# Patient Record
Sex: Male | Born: 1945 | Race: Black or African American | Hispanic: No | Marital: Married | Smoking: Current every day smoker
Health system: Southern US, Community
[De-identification: ages and names within clinical notes are randomized; demographics above are authoritative.]

## PROBLEM LIST (undated history)

## (undated) DIAGNOSIS — I10 Essential (primary) hypertension: Secondary | ICD-10-CM

## (undated) DIAGNOSIS — H02409 Unspecified ptosis of unspecified eyelid: Secondary | ICD-10-CM

## (undated) DIAGNOSIS — H3320 Serous retinal detachment, unspecified eye: Secondary | ICD-10-CM

## (undated) DIAGNOSIS — H44111 Panuveitis, right eye: Secondary | ICD-10-CM

## (undated) DIAGNOSIS — K859 Acute pancreatitis without necrosis or infection, unspecified: Secondary | ICD-10-CM

## (undated) DIAGNOSIS — I82409 Acute embolism and thrombosis of unspecified deep veins of unspecified lower extremity: Secondary | ICD-10-CM

## (undated) DIAGNOSIS — D649 Anemia, unspecified: Secondary | ICD-10-CM

## (undated) DIAGNOSIS — M129 Arthropathy, unspecified: Secondary | ICD-10-CM

## (undated) DIAGNOSIS — H269 Unspecified cataract: Secondary | ICD-10-CM

## (undated) DIAGNOSIS — B5801 Toxoplasma chorioretinitis: Secondary | ICD-10-CM

## (undated) DIAGNOSIS — G629 Polyneuropathy, unspecified: Secondary | ICD-10-CM

## (undated) DIAGNOSIS — H5010 Unspecified exotropia: Secondary | ICD-10-CM

## (undated) DIAGNOSIS — C259 Malignant neoplasm of pancreas, unspecified: Principal | ICD-10-CM

## (undated) DIAGNOSIS — H0520 Unspecified exophthalmos: Secondary | ICD-10-CM

## (undated) HISTORY — DX: Malignant neoplasm of pancreas, unspecified: C25.9

---

## 1964-07-01 HISTORY — PX: TONSILLECTOMY: SUR1361

## 1997-10-20 ENCOUNTER — Ambulatory Visit (HOSPITAL_COMMUNITY): Admission: RE | Admit: 1997-10-20 | Discharge: 1997-10-20 | Payer: Self-pay | Admitting: Urology

## 2001-02-10 ENCOUNTER — Emergency Department (HOSPITAL_COMMUNITY): Admission: EM | Admit: 2001-02-10 | Discharge: 2001-02-10 | Payer: Self-pay

## 2003-09-19 ENCOUNTER — Emergency Department (HOSPITAL_COMMUNITY): Admission: EM | Admit: 2003-09-19 | Discharge: 2003-09-19 | Payer: Self-pay | Admitting: Emergency Medicine

## 2006-07-01 HISTORY — PX: NECK SURGERY: SHX720

## 2007-02-03 ENCOUNTER — Ambulatory Visit (HOSPITAL_COMMUNITY): Admission: RE | Admit: 2007-02-03 | Discharge: 2007-02-03 | Payer: Self-pay | Admitting: Urology

## 2007-03-24 ENCOUNTER — Encounter: Admission: RE | Admit: 2007-03-24 | Discharge: 2007-03-24 | Payer: Self-pay | Admitting: Neurology

## 2007-05-02 HISTORY — PX: SPINE SURGERY: SHX786

## 2007-05-12 ENCOUNTER — Inpatient Hospital Stay (HOSPITAL_COMMUNITY): Admission: RE | Admit: 2007-05-12 | Discharge: 2007-05-15 | Payer: Self-pay | Admitting: Neurosurgery

## 2007-07-02 HISTORY — PX: BACK SURGERY: SHX140

## 2007-07-17 ENCOUNTER — Ambulatory Visit (HOSPITAL_COMMUNITY): Admission: RE | Admit: 2007-07-17 | Discharge: 2007-07-17 | Payer: Self-pay | Admitting: Neurosurgery

## 2007-07-28 ENCOUNTER — Inpatient Hospital Stay (HOSPITAL_COMMUNITY): Admission: RE | Admit: 2007-07-28 | Discharge: 2007-07-29 | Payer: Self-pay | Admitting: Neurosurgery

## 2007-10-02 ENCOUNTER — Ambulatory Visit: Payer: Self-pay | Admitting: *Deleted

## 2009-03-31 ENCOUNTER — Ambulatory Visit: Payer: Self-pay | Admitting: Vascular Surgery

## 2010-11-13 NOTE — Discharge Summary (Signed)
NAMELAMARIUS, DIRR              ACCOUNT NO.:  [id number]   MEDICAL RECORD NO.:  [id number]          PATIENT TYPE:  INP   LOCATION:  3005                         FACILITY:  [facility]   PHYSICIAN:  Hilda Lias, M.D.   DATE OF BIRTH:  01-Mar-1946   DATE OF ADMISSION:  05/12/2007  DATE OF DISCHARGE:  05/15/2007                               DISCHARGE SUMMARY   ADMISSION DIAGNOSIS:  Cervical myelopathy with stenosis C3-4, 4-5, 5-6  and 6-7.   FINAL DIAGNOSIS:  Cervical myelopathy with stenosis C3-4, 4-5, 5-6 and 6-  7.   CLINICAL HISTORY:  The patient was admitted because of weakness and neck  pain.  X-rays showed severe cervical stenosis from C3 down to C6-7.  Surgery was advised.   LABORATORY DATA:  Normal.   HOSPITAL COURSE:  The patient was taken to surgery and four-level  anterior cervical diskectomy, decompression and fusion were done.  Today, he is doing much better.  The wounds all look normal.  He is  swallowing.  The weakness that he had is almost gone.  He is being  discharged today to be followed by me in my office in 3 weeks or before  if needed.   CONDITION ON DISCHARGE:  Improving.   DISCHARGE MEDICATIONS:  1. Percocet.  2. Diazepam.   DIET:  Regular.   ACTIVITY:  He is not to drive for at least a week.   FOLLOW UP:  He is to be seen by me in 3 weeks or before.           ______________________________  Hilda Lias, M.D.     EB/MEDQ  D:  05/15/2007  T:  05/16/2007  Job:  [id number]

## 2010-11-13 NOTE — H&P (Signed)
NAME:  Marcus Lang, SCHAMP NO.:  [id number]   MEDICAL RECORD NO.:  [id number]          PATIENT TYPE:  INP   LOCATION:  2899                         FACILITY:  [facility]   PHYSICIAN:  Hilda Lias, M.D.   DATE OF BIRTH:  [birth year]   DATE OF ADMISSION:  05/12/2007  DATE OF DISCHARGE:                              HISTORY & PHYSICAL   HISTORY OF PRESENT ILLNESS:  Marcus Lang is a gentleman who was seen by  me on multiple times because of back pain with radiation to his right  leg, pain to the knees, which gets worse when he sits for a long period  of time.  Also, he complains of numbness in both hands associated with a  grounding sound in his neck.  The patient was seen by a neurologist and  x-rays were done and because of the findings, he was sent to [place] for  evaluation.   PAST MEDICAL HISTORY:  Headache.   MEDICATIONS:  None.   SOCIAL HISTORY:  He drinks 2 ounces of alcohol a day and he smokes a  pack of cigarettes a day.   FAMILY HISTORY:  Mother died when she was 54 with Alzheimer's.  Father  died when he was 55 with cancer of the esophagus.   PHYSICAL EXAMINATION:  GENERAL:  The patient came to my office limping  on the right leg.  HEENT:  Head, nose and throat normal.  NECK:  He has had decrease of flexibility of the cervical spine.  LUNGS:  Clear.  HEART:  Sounds normal.  ABDOMEN:  Normal.  .  NEURO:  He has weakness of biceps and wrist extensor.  He has some  dorsiflexion weakness of his right foot.  Numbness in both hands.  Deep  tendon reflexes are 1+ with no Babinski.  He has Tinel signs negative  bilaterally.  Coordination, when he tried to walk in a straight line, he  has difficulty going sideways.   Cervical MRI and x-ray showed degenerative disc disease, C3 down to C6-  C7.   CLINICAL IMPRESSION:  Cervical myelopathy with cervical stenosis from C3  down to C7.  Right L5 myelopathy.   RECOMMENDATIONS:  The patient is being admitted for surgery.   He is  going to have a decompressive diskectomy from C3 down to C7.  He knows  about the risks of infection, CSF leak, postoperative pain, paralysis,  need for further surgery, stroke, damage to the vocal chord, failure of  hardware, need of surgery in future.  Also he is fully aware that this  surgery will not correct his lumbar area.          ______________________________  Hilda Lias, M.D.    EB/MEDQ  D:  05/12/2007  T:  05/12/2007  Job:  [id number]

## 2010-11-13 NOTE — Procedures (Signed)
DUPLEX ULTRASOUND OF ABDOMINAL AORTA   INDICATION:  Followup abdominal aortic dilatation or aneurysm seen on x-  ray.   HISTORY:  Diabetes:  No.  Cardiac:  No.  Hypertension:  Yes.  Smoking:  Quit about a year ago.  Connective Tissue Disorder:  No.  Family History:  No.  Previous Surgery:  No.   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   ___________________________________________  Janetta Hora. Fields, MD   MG/MEDQ  D:  03/31/2009  T:  03/31/2009  Job:  [id number]

## 2010-11-13 NOTE — Op Note (Signed)
NAMECUINN, WESTERHOLD NO.:  [id number]   MEDICAL RECORD NO.:  [id number]          PATIENT TYPE:  INP   LOCATION:  3005                         FACILITY:  [facility]   PHYSICIAN:  Hilda Lias, M.D.   DATE OF BIRTH:  10-31-1945   DATE OF PROCEDURE:  05/12/2007  DATE OF DISCHARGE:                               OPERATIVE REPORT   PREOPERATIVE DIAGNOSES:  1. Cervical stenosis, C3-4, C4-5, C5-6, C6-7.  2. Cervical myelopathy.  3. Chronic radiculopathy.   POSTOPERATIVE DIAGNOSES:  1. Cervical stenosis, C3-4, C4-5, C5-6, C6-7.  2. Cervical myelopathy.  3. Chronic radiculopathy.   PROCEDURE:  Anterior 3-4, 4-5, 5-6, 6-7 decompression of spinal cord,  foraminotomy, interbody fusion with allograft, __________  plate for  __________  C7, microscope.   SURGEON:  Hilda Lias, M.D.   ASSISTANT:  Hewitt Shorts, M.D.   <CLINICAL HISTORY/>  Mr. Lembke is a gentleman who was admitted because of neck pain  associated with weakness and difficulty with walking.  X-rays show  severe stenosis from C3 down to C6-7.  Surgery was advised, and the risk  were explained to him in the history and physical.   PROCEDURE:  The patient was taken to the OR and after intubation, the  neck was cleaned with DuraPrep.   A longitudinal incision was made through the skin and subcutaneous  tissue down to the cervical spine.  X-rays showed that indeed we were at  the level of 5-6.  From then on, we opened the anterior ligament which  was calcified.  With the drill, we had to remove the disk at the level  of 5-6.  The same procedure was done at the level of 4-5, 3-4, and 6-7.  Then we brought the microscope into the area.  We started at the level  of 5-6 which was the worst.  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  The posterior ligament was  opened, and using the 1 and 2-mm Kerrison punch, decompression of the  spinal cord as well as the foramen  was achieved.  The endplates were  drilled also.  Then from then on, the same procedure was done at the  level of C6-7, with quite a bit of the same finding of stenosis of the  canal and the foramen.  Decompression was also achieved.  At the level  of 4-5 and 5-6, the same procedure with removal of the endplate and  decompression of the spinal cord and the foramen was achieved.  At the  end, we had good decompression of the spinal cord at those 4 levels,  with good foraminotomy to decompress the C3, C4, C5 and C6, C7 nerve  roots.  Then having done this, we tailored 4 bone graft of 7 mm height  slightly lordotic, and they were inserted without any problem.  This was  followed by a plate using __________ plate  using a total of 10 screws.  X-rays showed good position of the plate as  well as the screws as well as the bone graft.  From then on,  the area  was irrigated.  A Hemovac was left in the precervical area, and the  wound was closed with Vicryl and Steri-Strips.           ______________________________  Hilda Lias, M.D.     EB/MEDQ  D:  05/12/2007  T:  05/12/2007  Job:  [id number]   cc:   Evie Lacks, MD  Lucrezia Starch. Earlene Plater, M.D.

## 2010-11-13 NOTE — Op Note (Signed)
NAMEANGELDEJESUS, CALLAHAM NO.:  [id number]   MEDICAL RECORD NO.:  [id number]          PATIENT TYPE:  OIB   LOCATION:  3535                         FACILITY:  [facility]   PHYSICIAN:  Hilda Lias, M.D.   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   PROCEDURE:  Right L4-5 laminotomy, decompression of the L4 and L5 nerve  roots.  Right L5-S1 diskectomy, removal of a large fragment adherent to  the L5 nerve root.  Microscope.   SURGEON:  Hilda Lias, M.D.   ASSISTANT:  Hewitt Shorts, M.D.   CLINICAL HISTORY:  Mr. Christoffel is a 65 year old gentleman who underwent a  cervical fusion because myelopathy several weeks ago.  He had been  complaining off and on right leg pain.  X-rays showed that he had  multiple level stenosis and spondylosis the worst at the level 4-5 and 5-  1 to the right side with some fragment of disk at the level of 5-1 to  the right.  The patient declined more conservative treatment.  The  surgery was explained to him and the risks were explained in the history  and physical.   PROCEDURE:  The patient was taken to the OR and he was positioned in  prone manner.  The back was cleaned with DuraPrep.  Midline incision was  made from L4 down to L5-S1.  X-rays showed that indeed the clamps were  at the level L4-5.  We went laterally and we dissected all the way down  at the level of 4-5, 5-1.  With the microscope we drilled the lower  lamina of L4 the upper of L5.  We went through a thick yellow ligament.  Indeed we found some narrowing, but there was no evidence of any  herniated disk.  The L4 nerve root was opened.  Then we went down one  level and we opened at the level of  L5-S1.  X-rays showed indeed, this  was the area of 5-1.  We found really thick calcified ligament and the  ligament was removed and we found that there was degenerative disk with  a piece of fragment adherent to the L5 nerve roots going to the foramen.  Lysis was accomplished and at the end we were able to decompress the L5  nerve root.  Then we went to the disk space.  We entered disk space and  we found mostly degenerative disk disease toward the midline but soft  tissue toward the lateral aspect.  At the end we had good decompression  for the S1, L5, L4 nerve root.  The area was irrigated.  Valsalva  maneuver was negative.  Then fentanyl and Depo-Medrol were left in the  epidural space and the wound was closed with Vicryl and Steri-Strips.           ______________________________  Lynne Logan  Jeral Fruit, M.D.     EB/MEDQ  D:  07/28/2007  T:  07/29/2007  Job:  [id number]

## 2010-11-13 NOTE — Procedures (Signed)
DUPLEX ULTRASOUND OF ABDOMINAL AORTA   INDICATION:  Recent lower abdominal x-ray suggestive of aortic ectasia.   HISTORY:  Diabetes:  No.  Cardiac:  No.  Hypertension:  Yes.  Smoking:  Pack per day.  Connective Tissue Disorder:  Family History:  No.  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   ___________________________________________  P. Liliane Bade, M.D.   MC/MEDQ  D:  10/02/2007  T:  10/02/2007  Job:  [id number]

## 2010-11-13 NOTE — H&P (Signed)
Marcus Lang, Marcus Lang NO.:  [id number]   MEDICAL RECORD NO.:  [id number]          PATIENT TYPE:  OIB   LOCATION:  3599                         FACILITY:  [facility]   PHYSICIAN:  Hilda Lias, M.D.   DATE OF BIRTH:  1946/04/03   DATE OF ADMISSION:  07/28/2007  DATE OF DISCHARGE:                              HISTORY & PHYSICAL   Marcus Lang is a gentleman who underwent anterior cervical fusion  __________  several months ago.  He did really well, but as a __________  of his problem that he had been having a lot of back pain but mostly  radiation to down the right leg all the way down to the right foot  associated with weakness and no pain whatsoever in the left leg.  Because of that, we went ahead and we did a lumbar myelogram, and  because if the finding, he was to be admitted for surgery.   PAST MEDICAL HISTORY:  Anterior cervical fusion, October  2008.   SOCIAL HISTORY:  He smokes a pack a day, and he drinks socially.   FAMILY HISTORY:  Mother died at the age of 58 __________ .  Father died  of cancer of the __________ ; he was 56.   PHYSICAL EXAMINATION:  GENERAL:  When the patient came to my office, he  was limping on the right leg.  HEENT:  Normal.  NECK:  He has an anterior scar on the neck.  He has a good flexibility.  LUNGS:  Clear.  HEART:  Heart sounds normal.  __________  normal.  EXTREMITIES:  Normal pulses.  NEUROLOGIC:  He has weakness on dorsiflexion of the right leg.  He has  decrease of sensation with the L5 dermatome.  The rest of the  neurological examination is normal.   Cervical spine x-ray after surgery showed good fusion from C3 to C7.  The myelogram showed that indeed he had some multiple level disk  disease, the worst at the level of 4-5 and 5-1 where he has a herniated  disk with __________ 4-5 with right L5-S1 foraminal stenosis.   IMPRESSION:  Right L4-5, L5-S1 herniated disk right side with  degenerative disk disease at multiple  levels of the lumbar spine.  Status post cervical fusion.   RECOMMENDATIONS:  The patient is being admitted for surgery.  The  patient is fully aware that his problem is mostly at the level of 4-5/5-  1.  He denies any pain whatsoever in the left leg.  He wants to proceed  with surgery, and  the procedure will be a right 4-5/5-1 diskectomy.  He is fully aware  that we are no longer interested in the left side because he has no  symptoms whatsoever, but eventually there is a possibility that he might  develop __________ effusion.  He was aware of the risks such as  infection, CSF leak, worsening pain, paralysis __________ .           ______________________________  Hilda Lias, M.D.     EB/MEDQ  D:  07/28/2007  T:  07/28/2007  Job:  [id number]

## 2011-01-30 ENCOUNTER — Other Ambulatory Visit: Payer: Self-pay | Admitting: Neurosurgery

## 2011-01-30 DIAGNOSIS — M5410 Radiculopathy, site unspecified: Secondary | ICD-10-CM

## 2011-01-30 DIAGNOSIS — M542 Cervicalgia: Secondary | ICD-10-CM

## 2011-02-04 ENCOUNTER — Ambulatory Visit
Admission: RE | Admit: 2011-02-04 | Discharge: 2011-02-04 | Disposition: A | Discharge status: Home / Self Care | Payer: Medicare Other | Source: Ambulatory Visit | Attending: Neurosurgery | Admitting: Neurosurgery

## 2011-02-04 DIAGNOSIS — M542 Cervicalgia: Secondary | ICD-10-CM

## 2011-02-04 DIAGNOSIS — M5410 Radiculopathy, site unspecified: Secondary | ICD-10-CM

## 2011-02-04 MED ORDER — DIAZEPAM 2 MG PO TABS
5.0000 mg | ORAL_TABLET | Freq: Once | ORAL | Status: AC
  Administered 2011-02-04: 5 mg via ORAL

## 2011-02-04 MED ORDER — IOHEXOL 300 MG/ML  SOLN
10.0000 mL | Freq: Once | INTRAMUSCULAR | Status: AC | PRN
  Administered 2011-02-04: 10 mL via INTRATHECAL

## 2011-03-21 LAB — BASIC METABOLIC PANEL
BUN: 6
Chloride: 104
Creatinine, Ser: 0.98
Glucose, Bld: 93
Potassium: 4.3

## 2011-03-21 LAB — CBC
HCT: 43.2
MCHC: 34.5
MCV: 101.9 — ABNORMAL HIGH
Platelets: 232
RDW: 14.1

## 2011-04-09 LAB — BASIC METABOLIC PANEL
BUN: 10
Calcium: 9.5
Creatinine, Ser: 1.04
GFR calc Af Amer: >60
GFR calc non Af Amer: >60

## 2011-04-09 LAB — CBC
HCT: 43
MCV: 100.9 — ABNORMAL HIGH
RBC: 4.26
WBC: 8.4

## 2011-10-01 ENCOUNTER — Encounter: Payer: Self-pay | Admitting: Emergency Medicine

## 2011-10-01 ENCOUNTER — Ambulatory Visit (INDEPENDENT_AMBULATORY_CARE_PROVIDER_SITE_OTHER): Payer: Medicare Other | Admitting: Emergency Medicine

## 2011-10-01 ENCOUNTER — Ambulatory Visit: Payer: Medicare Other

## 2011-10-01 VITALS — BP 129/82 | HR 71 | Temp 97.9°F | Resp 16 | Ht 66.0 in | Wt 147.0 lb

## 2011-10-01 DIAGNOSIS — R7989 Other specified abnormal findings of blood chemistry: Secondary | ICD-10-CM

## 2011-10-01 DIAGNOSIS — Z0000 Encounter for general adult medical examination without abnormal findings: Secondary | ICD-10-CM

## 2011-10-01 DIAGNOSIS — R05 Cough: Secondary | ICD-10-CM

## 2011-10-01 DIAGNOSIS — I10 Essential (primary) hypertension: Secondary | ICD-10-CM

## 2011-10-01 DIAGNOSIS — E785 Hyperlipidemia, unspecified: Secondary | ICD-10-CM

## 2011-10-01 LAB — CBC WITH DIFFERENTIAL/PLATELET
Basophils Absolute: 0 10*3/uL (ref 0.0–0.1)
Eosinophils Relative: 1 % (ref 0–5)
HCT: 40.9 % (ref 39.0–52.0)
Lymphocytes Relative: 36 % (ref 12–46)
MCV: 99 fL (ref 78.0–100.0)
Monocytes Absolute: 0.4 10*3/uL (ref 0.1–1.0)
Monocytes Relative: 7 % (ref 3–12)
RDW: 13.3 % (ref 11.5–15.5)
WBC: 5.1 10*3/uL (ref 4.0–10.5)

## 2011-10-01 LAB — POCT URINALYSIS DIPSTICK
Bilirubin, UA: NEGATIVE
Glucose, UA: NEGATIVE
Ketones, UA: NEGATIVE
Leukocytes, UA: NEGATIVE
Spec Grav, UA: 1.025

## 2011-10-01 LAB — LIPID PANEL
Cholesterol: 203 mg/dL — ABNORMAL HIGH (ref 0–200)
Total CHOL/HDL Ratio: 3.7 Ratio
Triglycerides: 112 mg/dL (ref <150)
VLDL: 22 mg/dL (ref 0–40)

## 2011-10-01 LAB — POCT UA - MICROSCOPIC ONLY: Bacteria, U Microscopic: NEGATIVE

## 2011-10-01 LAB — COMPREHENSIVE METABOLIC PANEL
AST: 22 U/L (ref 0–37)
BUN: 10 mg/dL (ref 6–23)
CO2: 27 mEq/L (ref 19–32)
Calcium: 9.7 mg/dL (ref 8.4–10.5)
Chloride: 103 mEq/L (ref 96–112)
Creat: 1.18 mg/dL (ref 0.50–1.35)
Glucose, Bld: 115 mg/dL — ABNORMAL HIGH (ref 70–99)

## 2011-10-01 LAB — PSA, MEDICARE: PSA: 0.68 ng/mL (ref <=4.00)

## 2011-10-01 MED ORDER — AMLODIPINE BESYLATE 5 MG PO TABS: 5.0000 mg | ORAL_TABLET | Freq: Every day | ORAL | Status: DC

## 2011-10-01 NOTE — Progress Notes (Signed)
  Subjective:    Patient ID: Marcus Lang, male    DOB: January 17, 1946, 66 y.o.   MRN: [id number]  HPI patient enters for his yearly physical exam. He has not resumed smoking. He has been under a great deal of stress related to trying to care for his brother who is an alcoholic. He also is the main person in his family to handle    Review of Systems  Constitutional: Positive for unexpected weight change.  HENT: Negative.   Eyes: Negative.   Respiratory: Negative.   Cardiovascular: Negative.   Gastrointestinal:       Had a decreased appetite due to all the stress he is going to  Genitourinary: Negative.   Musculoskeletal:       And he's had some back discomfort where he had surgery but overall is doing  Neurological: Negative.   Hematological: Negative.   Psychiatric/Behavioral: Negative.        Objective:   Physical Exam  Constitutional: He is oriented to person, place, and time. He appears well-developed and well-nourished.  HENT:  Head: Normocephalic and atraumatic.  Right Ear: External ear normal.  Left Ear: External ear normal.  Eyes: Pupils are equal, round, and reactive to light.  Neck: No JVD present. No tracheal deviation present. No thyromegaly present.  Cardiovascular: Normal rate, regular rhythm and normal heart sounds.  Exam reveals no gallop and no friction rub.   No murmur heard. Pulmonary/Chest: No stridor. No respiratory distress. He has no wheezes. He has no rales. He exhibits no tenderness.  Genitourinary: Prostate normal.  Musculoskeletal: He exhibits no edema.  Lymphadenopathy:    He has no cervical adenopathy.  Neurological: He is alert and oriented to person, place, and time. He has normal reflexes.  Skin:       There is a 1 x 1.5 cm rough excoriated area adjacent to the umbilicus    [facility] reading (PRIMARY) by  Dr.Quamaine Webb NAD.  Her EKG shows no acute changes normal sinus rhythm. T down V2     Assessment & Plan:   Overall Marcus Lang seems to be doing  pretty well. He is under a great deal of stress advised that he contact hospice to see if we can help with the situation with his brother.

## 2011-10-03 ENCOUNTER — Encounter: Payer: Self-pay | Admitting: Emergency Medicine

## 2011-11-26 ENCOUNTER — Encounter: Payer: Self-pay | Admitting: Emergency Medicine

## 2012-01-01 ENCOUNTER — Ambulatory Visit (INDEPENDENT_AMBULATORY_CARE_PROVIDER_SITE_OTHER): Payer: Medicare Other | Admitting: Emergency Medicine

## 2012-01-01 ENCOUNTER — Ambulatory Visit: Payer: Medicare Other

## 2012-01-01 ENCOUNTER — Ambulatory Visit
Admission: RE | Admit: 2012-01-01 | Discharge: 2012-01-01 | Disposition: A | Discharge status: Home / Self Care | Payer: Medicare Other | Source: Ambulatory Visit | Attending: Emergency Medicine | Admitting: Emergency Medicine

## 2012-01-01 VITALS — BP 122/74 | HR 71 | Temp 98.6°F | Resp 14 | Ht 66.0 in | Wt 142.0 lb

## 2012-01-01 DIAGNOSIS — R109 Unspecified abdominal pain: Secondary | ICD-10-CM

## 2012-01-01 DIAGNOSIS — N12 Tubulo-interstitial nephritis, not specified as acute or chronic: Secondary | ICD-10-CM

## 2012-01-01 DIAGNOSIS — R3129 Other microscopic hematuria: Secondary | ICD-10-CM

## 2012-01-01 LAB — POCT URINALYSIS DIPSTICK
Glucose, UA: NEGATIVE
Nitrite, UA: NEGATIVE
Protein, UA: 30
Spec Grav, UA: 1.02
Urobilinogen, UA: 1

## 2012-01-01 LAB — COMPREHENSIVE METABOLIC PANEL
ALT: 23 U/L (ref 0–53)
AST: 35 U/L (ref 0–37)
Albumin: 4.4 g/dL (ref 3.5–5.2)
Alkaline Phosphatase: 129 U/L — ABNORMAL HIGH (ref 39–117)
Glucose, Bld: 120 mg/dL — ABNORMAL HIGH (ref 70–99)
Potassium: 3.9 mEq/L (ref 3.5–5.3)
Sodium: 140 mEq/L (ref 135–145)
Total Bilirubin: 1.2 mg/dL (ref 0.3–1.2)
Total Protein: 7 g/dL (ref 6.0–8.3)

## 2012-01-01 LAB — POCT UA - MICROSCOPIC ONLY
Casts, Ur, LPF, POC: NEGATIVE
Crystals, Ur, HPF, POC: NEGATIVE
Yeast, UA: NEGATIVE

## 2012-01-01 LAB — POCT CBC
Hemoglobin: 13.4 g/dL — AB (ref 14.1–18.1)
MCH, POC: 32.4 pg — AB (ref 27–31.2)
MCHC: 31.7 g/dL — AB (ref 31.8–35.4)
MID (cbc): 0.4 (ref 0–0.9)
MPV: 9 fL (ref 0–99.8)
POC Granulocyte: 2.5 (ref 2–6.9)
POC MID %: 9 %M (ref 0–12)
Platelet Count, POC: 219 10*3/uL (ref 142–424)
RBC: 4.14 M/uL — AB (ref 4.69–6.13)

## 2012-01-01 LAB — POCT SEDIMENTATION RATE: POCT SED RATE: 33 mm/hr — AB (ref 0–22)

## 2012-01-01 MED ORDER — IOHEXOL 300 MG/ML  SOLN
100.0000 mL | Freq: Once | INTRAMUSCULAR | Status: AC | PRN
  Administered 2012-01-01: 100 mL via INTRAVENOUS

## 2012-01-01 MED ORDER — CIPROFLOXACIN HCL 500 MG PO TABS: 500.0000 mg | ORAL_TABLET | Freq: Two times a day (BID) | ORAL | Status: AC

## 2012-01-01 NOTE — Progress Notes (Signed)
Subjective:    Patient ID: Marcus Lang, male    DOB: 05/22/1946, 66 y.o.   MRN: [id number]  HPI finish because the last 2 weeks she has had pain in his left upper abdomen. This has been associated with a 7 pound weight loss over the last couple of months. He has had a significant decrease in his appetite. He has not had any vomiting but does have an early satiety bowel movements have been normal. He has not noticed any urinary difficulty. He has not had any trauma to his abdomen to    Review of Systems     Objective:   Physical Exam  Constitutional: He appears well-developed and well-nourished.  HENT:  Head: Normocephalic.  Eyes: Pupils are equal, round, and reactive to light.  Neck: No thyromegaly present.  Cardiovascular: Normal rate and regular rhythm.   Pulmonary/Chest: No respiratory distress. He has no wheezes. He has no rales.  Abdominal:       The abdomen is flat. Bowel sounds are normal. With deep inspiration there is tenderness with a questionable spleen tip in the left upper abdomen. There is no lower abdominal discomfort     Results for orders placed in visit on 01/01/12  POCT CBC      Component Value Range   WBC 4.4 (*) 4.6 - 10.2 K/uL   Lymph, poc 1.5  0.6 - 3.4   POC LYMPH PERCENT 33.6  10 - 50 %L   MID (cbc) 0.4  0 - 0.9   POC MID % 9.0  0 - 12 %M   POC Granulocyte 2.5  2 - 6.9   Granulocyte percent 57.4  37 - 80 %G   RBC 4.14 (*) 4.69 - 6.13 M/uL   Hemoglobin 13.4 (*) 14.1 - 18.1 g/dL   HCT, POC 30.8 (*) 65.7 - 53.7 %   MCV 102.1 (*) 80 - 97 fL   MCH, POC 32.4 (*) 27 - 31.2 pg   MCHC 31.7 (*) 31.8 - 35.4 g/dL   RDW, POC 84.6     Platelet Count, POC 219  142 - 424 K/uL   MPV 9.0  0 - 99.8 fL  POCT URINALYSIS DIPSTICK      Component Value Range   Color, UA yellow     Clarity, UA clear     Glucose, UA neg     Bilirubin, UA neg     Ketones, UA neg     Spec Grav, UA 1.020     Blood, UA mod     pH, UA 6.0     Protein, UA 30     Urobilinogen, UA  1.0     Nitrite, UA neg     Leukocytes, UA Trace    POCT UA - MICROSCOPIC ONLY      Component Value Range   WBC, Ur, HPF, POC 2-6     RBC, urine, microscopic 6-11     Bacteria, U Microscopic 2+     Mucus, UA pos     Epithelial cells, urine per micros 2-3     Crystals, Ur, HPF, POC neg     Casts, Ur, LPF, POC neg     Yeast, UA neg     [facility] reading (PRIMARY) by  Dr. Cleta Alberts 2 pea-sized areas of calcification in the left upper abdomen there are 2 shadows in the left upper abdomen raising the question of an accessory spleen there are no acute findings seen on his acute abdominal series chest x-ray appears  normal       Assessment & Plan:  His urine is abnormal we'll go ahead and check a urine culture. He is scheduled for CT abdomen and pelvis with attention to the left upper abdomen. He was given A strainer. CT scan showed questionable inflammation around the pancreas versus possible inflammation around the left kidney. There were calcified granulomas in the liver. We'll go ahead and treat with Cipro 500 twice a day x1 week recheck one week if not better needs a cemented amylase lipase. We'll make sure he is not drinking any alcoholic beverages.

## 2012-01-03 LAB — URINE CULTURE: Organism ID, Bacteria: NO GROWTH

## 2012-01-09 ENCOUNTER — Ambulatory Visit (INDEPENDENT_AMBULATORY_CARE_PROVIDER_SITE_OTHER): Payer: Medicare Other | Admitting: Emergency Medicine

## 2012-01-09 VITALS — BP 122/76 | HR 70 | Temp 99.0°F | Resp 18 | Ht 67.0 in | Wt 140.0 lb

## 2012-01-09 DIAGNOSIS — I10 Essential (primary) hypertension: Secondary | ICD-10-CM

## 2012-01-09 DIAGNOSIS — M3390 Dermatopolymyositis, unspecified, organ involvement unspecified: Secondary | ICD-10-CM

## 2012-01-09 DIAGNOSIS — M3313 Other dermatomyositis without myopathy: Secondary | ICD-10-CM

## 2012-01-09 DIAGNOSIS — R7309 Other abnormal glucose: Secondary | ICD-10-CM

## 2012-01-09 DIAGNOSIS — R739 Hyperglycemia, unspecified: Secondary | ICD-10-CM

## 2012-01-09 LAB — POCT CBC
Granulocyte percent: 60.2 %G (ref 37–80)
MCV: 102.2 fL — AB (ref 80–97)
MID (cbc): 0.6 (ref 0–0.9)
MPV: 8 fL (ref 0–99.8)
POC Granulocyte: 4.6 (ref 2–6.9)
POC LYMPH PERCENT: 32.1 %L (ref 10–50)
POC MID %: 7.7 %M (ref 0–12)
Platelet Count, POC: 371 10*3/uL (ref 142–424)
RBC: 4.04 M/uL — AB (ref 4.69–6.13)
RDW, POC: 12.5 %

## 2012-01-09 LAB — POCT UA - MICROSCOPIC ONLY
Epithelial cells, urine per micros: NEGATIVE
WBC, Ur, HPF, POC: NEGATIVE
Yeast, UA: NEGATIVE

## 2012-01-09 LAB — POCT URINALYSIS DIPSTICK
Bilirubin, UA: NEGATIVE
Leukocytes, UA: NEGATIVE
Nitrite, UA: NEGATIVE
Protein, UA: NEGATIVE
pH, UA: 5.5

## 2012-01-09 NOTE — Progress Notes (Signed)
  Subjective:    Patient ID: Marcus Lang, male    DOB: 03-Dec-1945, 66 y.o.   MRN: [id number]  HPI patient in followup recent episode of abdominal pain. His CT showed some question of pancreatic inflammation and questionable inflammation around the left kidney. He has been treated with antibiotics and feels significantly better except for lack of return of his appetite. He check with Dr. Kinnie Scales and is scheduled for a colonoscopy in 2015.    Review of Systems     Objective:   Physical Exam HEENT exam is unremarkable his chest is clear his cardiac exam has regular rate no murmur the abdomen is soft and nontender there is no CVA tenderness.   Results for orders placed in visit on 01/09/12  POCT GLYCOSYLATED HEMOGLOBIN (HGB A1C)      Component Value Range   Hemoglobin A1C 5.1    POCT UA - MICROSCOPIC ONLY      Component Value Range   WBC, Ur, HPF, POC neg     RBC, urine, microscopic 1-3     Bacteria, U Microscopic neg     Mucus, UA neg     Epithelial cells, urine per micros neg     Crystals, Ur, HPF, POC neg     Casts, Ur, LPF, POC neg     Yeast, UA neg    POCT URINALYSIS DIPSTICK      Component Value Range   Color, UA yellow     Clarity, UA clear     Glucose, UA neg     Bilirubin, UA neg     Ketones, UA neg     Spec Grav, UA 1.010     Blood, UA small     pH, UA 5.5     Protein, UA neg     Urobilinogen, UA 0.2     Nitrite, UA neg     Leukocytes, UA Negative         Assessment & Plan:  Patient's sugar was up on last check we'll go ahead and repeat this today along with an A1c and check amylase lipase because his pancreas did appear to show some edema on his CT scan. Go ahead and do an amylase lipase today the he has lost a total of 7 pounds since he was here in April. Since his diet significantly to his weight recheck here in 2 weeks with attention to his weight if he continues to be dropping we'll need to do further acute evaluation of weight loss. Of note he is a smoker  but had chest x-ray in April that was read as no acute disease.

## 2012-01-10 ENCOUNTER — Other Ambulatory Visit: Payer: Self-pay | Admitting: Emergency Medicine

## 2012-01-10 DIAGNOSIS — K859 Acute pancreatitis without necrosis or infection, unspecified: Secondary | ICD-10-CM

## 2012-01-10 LAB — LIPASE: Lipase: 1180 U/L — ABNORMAL HIGH (ref 0–75)

## 2012-01-14 ENCOUNTER — Ambulatory Visit
Admission: RE | Admit: 2012-01-14 | Discharge: 2012-01-14 | Disposition: A | Discharge status: Home / Self Care | Payer: Medicare Other | Source: Ambulatory Visit | Attending: Emergency Medicine | Admitting: Emergency Medicine

## 2012-01-14 DIAGNOSIS — K859 Acute pancreatitis without necrosis or infection, unspecified: Secondary | ICD-10-CM

## 2012-01-21 ENCOUNTER — Other Ambulatory Visit: Payer: Self-pay | Admitting: Emergency Medicine

## 2012-04-07 ENCOUNTER — Ambulatory Visit (INDEPENDENT_AMBULATORY_CARE_PROVIDER_SITE_OTHER): Payer: Medicare Other | Admitting: Emergency Medicine

## 2012-04-07 ENCOUNTER — Ambulatory Visit: Payer: Medicare Other | Admitting: Emergency Medicine

## 2012-04-07 VITALS — BP 140/80 | HR 69 | Temp 98.0°F | Resp 16 | Ht 68.0 in | Wt 140.6 lb

## 2012-04-07 DIAGNOSIS — D649 Anemia, unspecified: Secondary | ICD-10-CM

## 2012-04-07 DIAGNOSIS — M129 Arthropathy, unspecified: Secondary | ICD-10-CM

## 2012-04-07 DIAGNOSIS — D531 Other megaloblastic anemias, not elsewhere classified: Secondary | ICD-10-CM

## 2012-04-07 DIAGNOSIS — M1990 Unspecified osteoarthritis, unspecified site: Secondary | ICD-10-CM | POA: Insufficient documentation

## 2012-04-07 DIAGNOSIS — D518 Other vitamin B12 deficiency anemias: Secondary | ICD-10-CM

## 2012-04-07 DIAGNOSIS — K859 Acute pancreatitis without necrosis or infection, unspecified: Secondary | ICD-10-CM | POA: Insufficient documentation

## 2012-04-07 DIAGNOSIS — D539 Nutritional anemia, unspecified: Secondary | ICD-10-CM

## 2012-04-07 HISTORY — DX: Arthropathy, unspecified: M12.9

## 2012-04-07 HISTORY — DX: Acute pancreatitis without necrosis or infection, unspecified: K85.90

## 2012-04-07 LAB — CBC WITH DIFFERENTIAL/PLATELET
Basophils Absolute: 0 10*3/uL (ref 0.0–0.1)
Basophils Relative: 0 % (ref 0–1)
Eosinophils Absolute: 0.1 10*3/uL (ref 0.0–0.7)
Eosinophils Relative: 1 % (ref 0–5)
HCT: 39.3 % (ref 39.0–52.0)
MCHC: 35.6 g/dL (ref 30.0–36.0)
MCV: 93.3 fL (ref 78.0–100.0)
Monocytes Absolute: 0.3 10*3/uL (ref 0.1–1.0)
Platelets: 224 10*3/uL (ref 150–400)
RDW: 13.5 % (ref 11.5–15.5)

## 2012-04-07 NOTE — Progress Notes (Signed)
  Subjective:    Patient ID: Marcus Lang, male    DOB: [birth year], 66 y.o.   MRN: [id number]  HPI patient enters for followup of pancreatitis and anemia. He did undergo a CT of the abdomen and pelvis which revealed some inflammation around the pancreas and kidney. Ultrasound did not show any signs of gallstones. Since his last visit here he has had no episodes of nausea vomiting or abdominal pain. He states he was unable to eat without difficulty he eats only about one meal a day but this is no different than previous.    Review of Systems     Objective:   Physical Exam  Constitutional: He appears well-developed and well-nourished.  Neck: No thyromegaly present.  Cardiovascular: Normal rate, regular rhythm and normal heart sounds.  Exam reveals no gallop and no friction rub.   No murmur heard. Pulmonary/Chest: Effort normal and breath sounds normal. No respiratory distress. He has no wheezes. He has no rales. He exhibits no tenderness.  Abdominal: Soft. He exhibits no distension and no mass. There is no tenderness. There is no rebound and no guarding.          Assessment & Plan:  Patient enters followup episode of pancreatitis. He also was found to be anemic. He states he feels fine. We'll recheck labs today. He declines a flu shot today. No change in treatment program. His blood pressure is good on amlodipine.Marland Kitchen

## 2012-04-07 NOTE — Patient Instructions (Addendum)
Acute Pancreatitis  Acute pancreatitis is a disease in which the pancreas becomes suddenly inflamed. The pancreas is a large gland located behind your stomach. The pancreas produces enzymes that help digest food. The pancreas also releases the hormones glucagon and insulin that help regulate blood sugar. Damage to the pancreas occurs when the digestive enzymes from the pancreas are activated and begin attacking the pancreas before being released into the intestine. Most acute attacks last a couple of days and can cause serious complications. Some people become dehydrated and develop low blood pressure. In severe cases, bleeding into the pancreas can lead to shock and can be life-threatening. The lungs, heart, and kidneys may fail.  CAUSES   Pancreatitis can happen to anyone. In some cases, the cause is unknown. Most cases are caused by:   Alcohol abuse.   Gallstones.  Other less common causes are:   Certain medicines.   Exposure to certain chemicals.   Infection.   Damage caused by an accident (trauma).   Abdominal surgery.  SYMPTOMS    Pain in the upper abdomen that may radiate to the back.   Tenderness and swelling of the abdomen.   Nausea and vomiting.  DIAGNOSIS   Your caregiver will perform a physical exam. Blood and stool tests may be done to confirm the diagnosis. Imaging tests may also be done, such as X-rays, CT scans, or an ultrasound of the abdomen.  TREATMENT   Treatment usually requires a stay in the hospital. Treatment may include:   Pain medicine.   Fluid replacement through an intravenous line (IV).   Placing a tube in the stomach to remove stomach contents and control vomiting.   Not eating for 3 or 4 days. This gives your pancreas a rest, because enzymes are not being produced that can cause further damage.   Antibiotic medicines if your condition is caused by an infection.   Surgery of the pancreas or gallbladder.  HOME CARE INSTRUCTIONS    Follow the diet advised by your  caregiver. This may involve avoiding alcohol and decreasing the amount of fat in your diet.   Eat smaller, more frequent meals. This reduces the amount of digestive juices the pancreas produces.   Drink enough fluids to keep your urine clear or pale yellow.   Only take over-the-counter or prescription medicines as directed by your caregiver.   Avoid drinking alcohol if it caused your condition.   Do not smoke.   Get plenty of rest.   Check your blood sugar at home as directed by your caregiver.   Keep all follow-up appointments as directed by your caregiver.  SEEK MEDICAL CARE IF:    You do not recover as quickly as expected.   You develop new or worsening symptoms.   You have persistent pain, weakness, or nausea.   You recover and then have another episode of pain.  SEEK IMMEDIATE MEDICAL CARE IF:    You are unable to eat or keep fluids down.   Your pain becomes severe.   You have a fever or persistent symptoms for more than 2 to 3 days.   You have a fever and your symptoms suddenly get worse.   Your skin or the white part of your eyes turn yellow (jaundice).   You develop vomiting.   You feel dizzy, or you faint.   Your blood sugar is high (over 300 mg/dL).  MAKE SURE YOU:    Understand these instructions.   Will watch your condition.   

## 2012-04-21 ENCOUNTER — Ambulatory Visit: Payer: Medicare Other | Admitting: Emergency Medicine

## 2012-08-11 ENCOUNTER — Ambulatory Visit (INDEPENDENT_AMBULATORY_CARE_PROVIDER_SITE_OTHER): Payer: Medicare Other | Admitting: Emergency Medicine

## 2012-08-11 VITALS — BP 144/84 | HR 70 | Resp 16 | Ht 68.0 in | Wt 144.0 lb

## 2012-08-11 DIAGNOSIS — K859 Acute pancreatitis without necrosis or infection, unspecified: Secondary | ICD-10-CM

## 2012-08-11 DIAGNOSIS — I10 Essential (primary) hypertension: Secondary | ICD-10-CM

## 2012-08-11 LAB — CBC WITH DIFFERENTIAL/PLATELET
Eosinophils Absolute: 0.1 10*3/uL (ref 0.0–0.7)
Eosinophils Relative: 1 % (ref 0–5)
HCT: 38.3 % — ABNORMAL LOW (ref 39.0–52.0)
Hemoglobin: 13.6 g/dL (ref 13.0–17.0)
Lymphs Abs: 1.6 10*3/uL (ref 0.7–4.0)
MCH: 33.7 pg (ref 26.0–34.0)
MCV: 94.8 fL (ref 78.0–100.0)
Monocytes Absolute: 0.3 10*3/uL (ref 0.1–1.0)
Monocytes Relative: 6 % (ref 3–12)
Neutrophils Relative %: 59 % (ref 43–77)
RBC: 4.04 MIL/uL — ABNORMAL LOW (ref 4.22–5.81)

## 2012-08-11 LAB — LIPID PANEL
Cholesterol: 207 mg/dL — ABNORMAL HIGH (ref 0–200)
Triglycerides: 89 mg/dL (ref <150)
VLDL: 18 mg/dL (ref 0–40)

## 2012-08-11 LAB — COMPREHENSIVE METABOLIC PANEL
CO2: 27 mEq/L (ref 19–32)
Glucose, Bld: 157 mg/dL — ABNORMAL HIGH (ref 70–99)
Sodium: 139 mEq/L (ref 135–145)
Total Bilirubin: 0.9 mg/dL (ref 0.3–1.2)
Total Protein: 7.1 g/dL (ref 6.0–8.3)

## 2012-08-11 NOTE — Progress Notes (Signed)
  Subjective:    Patient ID: Marcus Lang, male    DOB: 08-Aug-1945, 67 y.o.   MRN: [id number]  HPI patient enters for followup of hypertension and anemia and pancreatitis. He feels great. He has been taking all his medication. He does have significant stress at home mainly related to taking care of multiple family members. He physically and financially assist in the care of almost all his grandchildren. He has not had any chest pain abdominal pain or any other symptoms    Review of Systems     Objective:   Physical Exam patient is alert and cooperative not in distress. His neck is supple chest clear heart regular rate no murmurs abdomen soft nontender . Extremities have no edema        Assessment & Plan:  Patient stable at present no changes in medications. Routine labs were done today.

## 2012-11-02 ENCOUNTER — Other Ambulatory Visit: Payer: Self-pay | Admitting: Emergency Medicine

## 2012-12-22 ENCOUNTER — Encounter: Payer: Self-pay | Admitting: Emergency Medicine

## 2012-12-22 ENCOUNTER — Ambulatory Visit: Payer: Medicare Other

## 2012-12-22 ENCOUNTER — Ambulatory Visit (INDEPENDENT_AMBULATORY_CARE_PROVIDER_SITE_OTHER): Payer: Medicare Other | Admitting: Emergency Medicine

## 2012-12-22 ENCOUNTER — Other Ambulatory Visit: Payer: Self-pay | Admitting: Emergency Medicine

## 2012-12-22 VITALS — BP 120/86 | HR 61 | Temp 98.0°F | Resp 16 | Ht 67.0 in | Wt 136.6 lb

## 2012-12-22 DIAGNOSIS — Z0000 Encounter for general adult medical examination without abnormal findings: Secondary | ICD-10-CM

## 2012-12-22 DIAGNOSIS — R634 Abnormal weight loss: Secondary | ICD-10-CM

## 2012-12-22 DIAGNOSIS — Z139 Encounter for screening, unspecified: Secondary | ICD-10-CM

## 2012-12-22 DIAGNOSIS — M549 Dorsalgia, unspecified: Secondary | ICD-10-CM

## 2012-12-22 LAB — CBC WITH DIFFERENTIAL/PLATELET
Eosinophils Relative: 3 % (ref 0–5)
HCT: 43.9 % (ref 39.0–52.0)
Hemoglobin: 15 g/dL (ref 13.0–17.0)
Lymphocytes Relative: 40 % (ref 12–46)
Lymphs Abs: 2.3 10*3/uL (ref 0.7–4.0)
MCV: 85.1 fL (ref 78.0–100.0)
Monocytes Absolute: 0.5 10*3/uL (ref 0.1–1.0)
RBC: 5.16 MIL/uL (ref 4.22–5.81)
WBC: 5.8 10*3/uL (ref 4.0–10.5)

## 2012-12-22 LAB — COMPREHENSIVE METABOLIC PANEL
ALT: 13 U/L (ref 0–53)
BUN: 9 mg/dL (ref 6–23)
CO2: 26 mEq/L (ref 19–32)
Calcium: 9.5 mg/dL (ref 8.4–10.5)
Chloride: 102 mEq/L (ref 96–112)
Creat: 0.9 mg/dL (ref 0.50–1.35)

## 2012-12-22 LAB — POCT URINALYSIS DIPSTICK
Bilirubin, UA: NEGATIVE
Glucose, UA: NEGATIVE
Spec Grav, UA: 1.01
Urobilinogen, UA: 0.2

## 2012-12-22 LAB — LIPID PANEL
Cholesterol: 153 mg/dL (ref 0–200)
HDL: 51 mg/dL (ref >39)
LDL Cholesterol: 82 mg/dL (ref 0–99)
Triglycerides: 99 mg/dL (ref <150)

## 2012-12-22 NOTE — Progress Notes (Signed)
  Subjective:    Patient ID: Marcus Lang, male    DOB: 17-May-1946, 67 y.o.   MRN: [id number]  HPI    Review of Systems  Constitutional: Positive for appetite change.  HENT: Negative.   Eyes: Negative.   Respiratory: Negative.   Cardiovascular: Negative.   Gastrointestinal: Negative.   Endocrine: Negative.   Genitourinary: Positive for testicular pain.  Musculoskeletal: Negative.   Skin: Negative.   Allergic/Immunologic: Negative.   Neurological: Negative.   Hematological: Negative.   Psychiatric/Behavioral: Negative.        Objective:   Physical Exam HEENT exam is unremarkable. Neck is supple chest is clear abdomen is soft without tenderness or masses. Cardiac is regular rate without murmurs. Rectal exam shows a normal prostate stool was sent for hematuric  Extremities are without cyanosis clubbing or edema. There are scars present over the anterior C-spine and lower lumbar spine [facility] reading (PRIMARY) by  Dr. Cleta Alberts no acute disease       Assessment & Plan:  It is unclear whether his weight loss is due to all the stress he is under a whether he could have something going on occultly. Routine labs were done we'll repeat a CT abdomen and pelvis to be sure the pancreas is okay routine chest x-ray will also be done because he does have a smoking history. He will b no if he improves with his weight after he tries the protein supplements. If he fails we'll try supplements for pancreatic insufficiency .

## 2012-12-24 ENCOUNTER — Telehealth: Payer: Self-pay | Admitting: Radiology

## 2012-12-24 ENCOUNTER — Ambulatory Visit
Admission: RE | Admit: 2012-12-24 | Discharge: 2012-12-24 | Disposition: A | Discharge status: Home / Self Care | Payer: Medicare Other | Source: Ambulatory Visit | Attending: Emergency Medicine | Admitting: Emergency Medicine

## 2012-12-24 ENCOUNTER — Other Ambulatory Visit: Payer: Self-pay | Admitting: Emergency Medicine

## 2012-12-24 DIAGNOSIS — R634 Abnormal weight loss: Secondary | ICD-10-CM

## 2012-12-24 DIAGNOSIS — K869 Disease of pancreas, unspecified: Secondary | ICD-10-CM

## 2012-12-24 DIAGNOSIS — R932 Abnormal findings on diagnostic imaging of liver and biliary tract: Secondary | ICD-10-CM

## 2012-12-24 LAB — HEMOGLOBIN A1C
Hgb A1c MFr Bld: 5.4 % (ref <5.7)
Mean Plasma Glucose: 108 mg/dL (ref <117)

## 2012-12-24 MED ORDER — IOHEXOL 300 MG/ML  SOLN
100.0000 mL | Freq: Once | INTRAMUSCULAR | Status: AC | PRN
  Administered 2012-12-24: 100 mL via INTRAVENOUS

## 2012-12-24 NOTE — Telephone Encounter (Signed)
Called patient to see if he and his wife can come in for CT review today. He is coming today. Have called Dr Myna Hidalgo to call Dr Cleta Alberts about this patient.

## 2012-12-24 NOTE — Telephone Encounter (Signed)
Call report from radiology new lesions pancreas and liver / worrisome for pancreatic CA with metastasis to liver, report being faxed, not able to be viewed in epic yet.

## 2012-12-24 NOTE — Telephone Encounter (Signed)
Put in referral to Dr Myna Hidalgo. We have not heard from him yet.

## 2012-12-25 ENCOUNTER — Other Ambulatory Visit: Payer: Self-pay | Admitting: Radiology

## 2012-12-25 DIAGNOSIS — K869 Disease of pancreas, unspecified: Secondary | ICD-10-CM

## 2012-12-28 ENCOUNTER — Encounter: Payer: Self-pay | Admitting: Emergency Medicine

## 2012-12-29 ENCOUNTER — Encounter (HOSPITAL_COMMUNITY): Payer: Self-pay | Admitting: *Deleted

## 2012-12-29 ENCOUNTER — Encounter (HOSPITAL_COMMUNITY): Payer: Self-pay | Admitting: Pharmacy Technician

## 2012-12-29 ENCOUNTER — Other Ambulatory Visit: Payer: Self-pay | Admitting: Gastroenterology

## 2012-12-29 NOTE — Addendum Note (Signed)
Addended by: Willis Modena on: 12/29/2012 08:43 PM   Modules accepted: Orders

## 2012-12-29 NOTE — Progress Notes (Signed)
Thank you so much for your help with Marcus Lang. He is a very nice man 

## 2012-12-30 ENCOUNTER — Encounter: Payer: Self-pay | Admitting: Hematology & Oncology

## 2012-12-30 ENCOUNTER — Other Ambulatory Visit: Payer: Self-pay | Admitting: Hematology & Oncology

## 2012-12-30 ENCOUNTER — Ambulatory Visit (HOSPITAL_COMMUNITY): Payer: Medicare Other | Admitting: Anesthesiology

## 2012-12-30 ENCOUNTER — Encounter (HOSPITAL_COMMUNITY): Payer: Self-pay | Admitting: Anesthesiology

## 2012-12-30 ENCOUNTER — Ambulatory Visit (HOSPITAL_COMMUNITY)
Admission: RE | Admit: 2012-12-30 | Discharge: 2012-12-30 | Disposition: A | Discharge status: Home / Self Care | Payer: Medicare Other | Source: Ambulatory Visit | Attending: Gastroenterology | Admitting: Gastroenterology

## 2012-12-30 ENCOUNTER — Encounter (HOSPITAL_COMMUNITY)
Admission: RE | Disposition: A | Discharge status: Home / Self Care | Payer: Self-pay | Source: Ambulatory Visit | Attending: Gastroenterology

## 2012-12-30 ENCOUNTER — Encounter (HOSPITAL_COMMUNITY): Payer: Self-pay

## 2012-12-30 DIAGNOSIS — K7689 Other specified diseases of liver: Secondary | ICD-10-CM | POA: Insufficient documentation

## 2012-12-30 DIAGNOSIS — C259 Malignant neoplasm of pancreas, unspecified: Secondary | ICD-10-CM

## 2012-12-30 DIAGNOSIS — C251 Malignant neoplasm of body of pancreas: Secondary | ICD-10-CM | POA: Insufficient documentation

## 2012-12-30 HISTORY — PX: EUS: SHX5427

## 2012-12-30 HISTORY — DX: Essential (primary) hypertension: I10

## 2012-12-30 HISTORY — DX: Malignant neoplasm of pancreas, unspecified: C25.9

## 2012-12-30 SURGERY — ESOPHAGEAL ENDOSCOPIC ULTRASOUND (EUS) RADIAL
Anesthesia: Monitor Anesthesia Care

## 2012-12-30 MED ORDER — ONDANSETRON HCL 4 MG/2ML IJ SOLN
INTRAMUSCULAR | Status: DC | PRN
  Administered 2012-12-30: 4 mg via INTRAVENOUS

## 2012-12-30 MED ORDER — LIDOCAINE HCL (CARDIAC) 20 MG/ML IV SOLN
INTRAVENOUS | Status: DC | PRN
  Administered 2012-12-30: 100 mg via INTRAVENOUS

## 2012-12-30 MED ORDER — BUTAMBEN-TETRACAINE-BENZOCAINE 2-2-14 % EX AERO
INHALATION_SPRAY | CUTANEOUS | Status: DC | PRN
  Administered 2012-12-30: 2 via TOPICAL

## 2012-12-30 MED ORDER — KETAMINE HCL 10 MG/ML IJ SOLN
INTRAMUSCULAR | Status: DC | PRN
  Administered 2012-12-30: 20 mg via INTRAVENOUS

## 2012-12-30 MED ORDER — SODIUM CHLORIDE 0.9 % IV SOLN: INTRAVENOUS | Status: DC

## 2012-12-30 MED ORDER — LACTATED RINGERS IV SOLN
INTRAVENOUS | Status: DC
  Administered 2012-12-30: 10:00:00 via INTRAVENOUS

## 2012-12-30 MED ORDER — PROPOFOL INFUSION 10 MG/ML OPTIME
INTRAVENOUS | Status: DC | PRN
  Administered 2012-12-30: 160 ug/kg/min via INTRAVENOUS

## 2012-12-30 NOTE — Preoperative (Signed)
Beta Blockers   Reason not to administer Beta Blockers:Not Applicable, not on home BB 

## 2012-12-30 NOTE — H&P (View-Only) (Signed)
Thank you so much for your help with Marcus Lang. He is a very nice man

## 2012-12-30 NOTE — Transfer of Care (Signed)
Immediate Anesthesia Transfer of Care Note  Patient: Marcus Lang  Procedure(s) Performed: Procedure(s) (LRB): ESOPHAGEAL ENDOSCOPIC ULTRASOUND (EUS) RADIAL (N/A)  Patient Location: PACU  Anesthesia Type: MAC  Level of Consciousness: sedated, patient cooperative and responds to stimulaton  Airway & Oxygen Therapy: Patient Spontanous Breathing and Patient connected to face mask oxgen  Post-op Assessment: Report given to PACU RN and Post -op Vital signs reviewed and stable  Post vital signs: Reviewed and stable  Complications: No apparent anesthesia complications

## 2012-12-30 NOTE — Anesthesia Postprocedure Evaluation (Signed)
Anesthesia Post Note  Patient: Marcus Lang CLASS  Procedure(s) Performed: Procedure(s) (LRB): ESOPHAGEAL ENDOSCOPIC ULTRASOUND (EUS) RADIAL (N/A)  Anesthesia type: MAC  Patient location: PACU  Post pain: Pain level controlled  Post assessment: Post-op Vital signs reviewed  Last Vitals: BP 169/93  Pulse 63  Temp(Src) 36.4 C (Oral)  Resp 18  SpO2 99%  Post vital signs: Reviewed  Level of consciousness: awake  Complications: No apparent anesthesia complications

## 2012-12-30 NOTE — Interval H&P Note (Signed)
History and Physical Interval Note:  12/30/2012 11:02 AM  Marcus Lang  has presented today for surgery, with the diagnosis of abnormal CT  The various methods of treatment have been discussed with the patient and family. After consideration of risks, benefits and other options for treatment, the patient has consented to  Procedure(s): ESOPHAGEAL ENDOSCOPIC ULTRASOUND (EUS) RADIAL (N/A) as a surgical intervention .  The patient's history has been reviewed, patient examined, no change in status, stable for surgery.  I have reviewed the patient's chart and labs.  Questions were answered to the patient's satisfaction.     Terald Jump M  Assessment:  1.  Pancreatic mass, with small right lobe liver lesions, both worrisome for malignancy.  Plan:  1.  Endoscopic ultrasound with biopsies (EUS +/- FNA). 2.  Risks (bleeding, infection, bowel perforation that could require surgery, sedation-related changes in cardiopulmonary systems), benefits (identification and possible treatment of source of symptoms, exclusion of certain causes of symptoms), and alternatives (watchful waiting, radiographic imaging studies, empiric medical treatment) of upper endoscopy with ultrasound and possible biopsies (EUS +/- FNA) were explained to patient/family in detail and patient wishes to proceed.

## 2012-12-30 NOTE — Anesthesia Preprocedure Evaluation (Addendum)
Anesthesia Evaluation  Patient identified by MRN, date of birth, ID band Patient awake    Reviewed: Allergy & Precautions, H&P , NPO status , Patient's Chart, lab work & pertinent test results  Airway Mallampati: II TM Distance: >3 FB Neck ROM: Full    Dental  (+) Dental Advisory Given and Poor Dentition   Pulmonary former smoker,  breath sounds clear to auscultation- rhonchi        Cardiovascular hypertension, Pt. on medications Rhythm:Regular Rate:Normal     Neuro/Psych negative neurological ROS  negative psych ROS   GI/Hepatic negative GI ROS, Neg liver ROS,   Endo/Other  negative endocrine ROS  Renal/GU negative Renal ROS     Musculoskeletal negative musculoskeletal ROS (+)   Abdominal   Peds  Hematology negative hematology ROS (+)   Anesthesia Other Findings   Reproductive/Obstetrics negative OB ROS                          Anesthesia Physical Anesthesia Plan  ASA: II  Anesthesia Plan: MAC   Post-op Pain Management:    Induction: Intravenous  Airway Management Planned: Simple Face Mask and Nasal Cannula  Additional Equipment:   Intra-op Plan:   Post-operative Plan:   Informed Consent: I have reviewed the patients History and Physical, chart, labs and discussed the procedure including the risks, benefits and alternatives for the proposed anesthesia with the patient or authorized representative who has indicated his/her understanding and acceptance.   Dental advisory given  Plan Discussed with: CRNA  Anesthesia Plan Comments:         Anesthesia Quick Evaluation

## 2012-12-30 NOTE — Op Note (Addendum)
[facility] [address] [place], [address]   ENDOSCOPIC ULTRASOUND PROCEDURE REPORT  PATIENT: Lang, Marcus  MR#: [id number] BIRTHDATE: 11/14/1945  GENDER: Male ENDOSCOPIST: Willis Modena, MD REFERRED BY:  Lesle Chris, M.D.  Arlan Organ, M.D. PROCEDURE DATE:  12/30/2012 PROCEDURE:   Upper EUS w/FNA ASA CLASS:      Class II INDICATIONS:   1.  pancreatic mass. MEDICATIONS: MAC sedation, administered by CRNA and Cetacaine spray x 2  DESCRIPTION OF PROCEDURE:   After the risks benefits and alternatives of the procedure were  explained, informed consent was obtained. The patient was then placed in the left, lateral, decubitus postion and IV sedation was administered. Throughout the procedure, the patients blood pressure, pulse and oxygen saturations were monitored continuously.  Under direct visualization, the linear echoendoscope was introduced through the mouth  and advanced to the second portion of the duodenum .  Water was used as necessary to provide an acoustic interface.  Upon completion of the imaging, water was removed and the patient was sent to the recovery room in satisfactory condition.     FINDINGS:      Pancreatic head and uncinate pancreas normal.  In body of pancreas, just upstream from the neck, a hypoechoic ill-defined 18 x 28mm  mass was seen.  The mass seems to invade portions of the splenic artery and vein, but there is no clear involvement of the superior mesenteric artery or celiac artery. The pancreatic duct is dilated (  6mm) and ectatic upstream of the mass. There is no peripancreatic or peritumoral adenopathy. Gallbladder appears normal.  Right hepatic lesions, seen on CT adjacent to the gallbladder and middle portal vein, were not readily visible on EUS.  Mass was subsequently FNA biopsied x 3 with 25g needle; preliminary cytology, reviewed in my presence by Dr. Dierdre Searles, was worrisome for adenocarcinoma.  IMPRESSION:      Pancreatic mass, as described above.  RECOMMENDATIONS:     1.  Watch for potential complications of procedure. 2.  Await final cytology results. 3.  Patient will probably require PET-CT for better characterization of right lobe liver lesions. 4.  If liver lesions are felt to be benign, patient would need surgical evaluation.  If liver lesions are felt or proven to be malignant, then chemotherapy would be next step in management. 5.  Will discuss with Drs. Daub and Ennever.   _______________________________ Rosalie DoctorWillis Modena, MD 12/30/2012 12:12 PM Revised: 12/30/2012 12:12 PM CC:

## 2012-12-31 ENCOUNTER — Telehealth: Payer: Self-pay | Admitting: Hematology & Oncology

## 2012-12-31 ENCOUNTER — Encounter (HOSPITAL_COMMUNITY): Payer: Self-pay | Admitting: Gastroenterology

## 2012-12-31 NOTE — Telephone Encounter (Signed)
Per Md orders to sch New Patient apt for 01/05/13.  Apt was sch for 01/05/13 at 11:15... i called patient to give apt date/time.  There was no answer, so i left message on voicemail with information and ask for patient to call back to confirm message was received, and calendar was mailed out to patient's home

## 2013-01-05 ENCOUNTER — Other Ambulatory Visit (HOSPITAL_BASED_OUTPATIENT_CLINIC_OR_DEPARTMENT_OTHER): Payer: Medicare Other | Admitting: Lab

## 2013-01-05 ENCOUNTER — Ambulatory Visit (HOSPITAL_BASED_OUTPATIENT_CLINIC_OR_DEPARTMENT_OTHER): Payer: Medicare Other | Admitting: Hematology & Oncology

## 2013-01-05 ENCOUNTER — Ambulatory Visit: Payer: Medicare Other

## 2013-01-05 VITALS — BP 134/78 | HR 68 | Temp 98.1°F | Resp 18 | Ht 66.0 in | Wt 133.0 lb

## 2013-01-05 DIAGNOSIS — C259 Malignant neoplasm of pancreas, unspecified: Secondary | ICD-10-CM

## 2013-01-05 LAB — CBC WITH DIFFERENTIAL (CANCER CENTER ONLY)
BASO%: 0.2 % (ref 0.0–2.0)
Eosinophils Absolute: 0.1 10*3/uL (ref 0.0–0.5)
LYMPH%: 33.2 % (ref 14.0–48.0)
MCV: 97 fL (ref 82–98)
MONO#: 0.4 10*3/uL (ref 0.1–0.9)
MONO%: 6.6 % (ref 0.0–13.0)
NEUT#: 3.2 10*3/uL (ref 1.5–6.5)
Platelets: 207 10*3/uL (ref 145–400)
RBC: 4.11 10*6/uL — ABNORMAL LOW (ref 4.20–5.70)
RDW: 12.5 % (ref 11.1–15.7)
WBC: 5.4 10*3/uL (ref 4.0–10.0)

## 2013-01-05 LAB — CMP (CANCER CENTER ONLY)
AST: 27 U/L (ref 11–38)
Alkaline Phosphatase: 112 U/L — ABNORMAL HIGH (ref 26–84)
BUN, Bld: 10 mg/dL (ref 7–22)
Glucose, Bld: 146 mg/dL — ABNORMAL HIGH (ref 73–118)
Potassium: 4.1 mEq/L (ref 3.3–4.7)
Sodium: 141 mEq/L (ref 128–145)
Total Bilirubin: 1.6 mg/dl (ref 0.20–1.60)

## 2013-01-05 LAB — CANCER ANTIGEN 19-9: CA 19-9: 503.9 U/mL — ABNORMAL HIGH (ref <35.0)

## 2013-01-05 LAB — PREALBUMIN: Prealbumin: 27.3 mg/dL (ref 17.0–34.0)

## 2013-01-05 NOTE — Progress Notes (Signed)
This office note has been dictated.

## 2013-01-06 NOTE — Progress Notes (Signed)
CC:   Stan Head. Cleta Alberts, M.D.  DIAGNOSIS:  Pancreatic cancer, stage unclear.  HISTORY PRESENT ILLNESS:  Mr. Alberta is a very nice 67 year old African American gentleman.  He has been in good health his whole life.  He is being followed by Dr. Cleta Alberts.  Mr. Xiao has been having some abdominal discomfort.  He has been losing some weight.  Appetite has been down a little bit.  He has had no change in bowel or bladder habits.  He has had no bleeding.  He has had no cough or shortness of breath.  He has had a little bit more fatigue.  He subsequently had a CT scan done.  This was not done of the abdomen and pelvis.  The CT scan showed that there was a pancreatic mass.  This measured 2.3 x 1.9 cm. He had some benign-appearing hepatic cysts, but he did have 2 suspicious lesions.  A 2.3 x 1.7 cm lesion was noted in the right hepatic lobe.  Also noted was a 1.2 x 0.9 cm lesion near the middle portal vein.  No obvious lymphadenopathy was appreciated.  He was then referred to Dr. Willis Modena.  Dr. Dulce Sellar went ahead and did an endoscopic ultrasound on him.  The endoscopic ultrasound did show a pancreatic mass.  This was in the body of the pancreas.  It measured 1.8 x 2.8 cm.  There was some invasion to the splenic artery and vein, but involvement of the superior mesenteric artery or celiac artery.  No peripancreatic or peritumoral adenopathy was noted.  We cannot see any lesions in the liver from a technical point of view.  Biopsies were done of the pancreatic mass.  The pathology report ([id number]) showed adenocarcinoma.  He was kindly referred to the [facility] by Dr. Cleta Alberts for [place] to consider further recommendations.  His weight continues to drop.  He had has never been a "big eater."  He has had some abdominal pain after eating.  There has been no diarrhea. He has had no nausea or vomiting.  There has been no cough or shortness of breath.  He has had no leg  swelling. There have been no rashes.  Overall, his performance status has been quite good.  I would say his performance status is ECOG 1.  PAST MEDICAL HISTORY:  Remarkable for hypertension.  ALLERGIES:  None.  MEDICATIONS:  Norvasc 5 mg p.o. daily, aspirin 81 mg p.o. daily.  SOCIAL HISTORY:  Past tobacco use.  He stopped about 6 years ago.  He may have about a 40 pack-year history of tobacco use.  There is no alcohol use.  He was working and was in [employer] representative.  He also worked for the [employer].  FAMILY HISTORY:  Esophageal cancer and throat cancer.  REVIEW OF SYSTEMS:  As stated in History Present of Illness.  No additional findings noted on a 12-system review.  PHYSICAL EXAMINATION:  General:  This is a well-developed, well- nourished African American gentleman in no obvious distress.  Vital Signs:  Temperature of 98.1, pulse 68, respiratory rate 18, blood pressure 134/78.  Weight is 133.  Head and Neck:  Normocephalic, atraumatic skull.  There are no ocular or oral lesions.  There are no palpable cervical or supraclavicular lymph nodes.  Lungs:  Clear bilaterally.  Cardiac:  Regular rate and rhythm with a normal S1 and S2. There are no murmurs, rubs, or bruits.  Abdomen:  Soft.  He has good bowel sounds.  There  is no fluid wave.  There may be some slight abdominal distention.  There is no palpable abdominal mass.  There is palpable hepatosplenomegaly.  Back:  No tenderness over the spine, ribs, or hips.  Extremities:  No clubbing, cyanosis, or edema.  He has good range motion of his joints.  He has good strength in his legs.  Skin: No rashes, ecchymoses, or petechia.  Neurological:  No focal neurological deficits.  LABORATORY STUDIES:  White cell count 5.4, hemoglobin 13.8, hematocrit 38.7, and platelet count 207.  Bilirubin is 1.6.  Total protein 7.9 with an albumin of 4.4.  IMPRESSION:  Mr. Damon is a very nice 67 year old African American gentleman with  pancreatic cancer.  This is adenocarcinoma.  I think the real key is going to be whether or not he has disease in his liver.  CT scan is very suggestive of this.  I think we are going to have to get a PET scan to see if this is true. If the PET scan does show metastasis to the liver, then he would be treated with chemotherapy.  I would favor Abraxane/gemcitabine.  I think if we are going to chemotherapy, he will need a Port-A-Cath. Apparently, if for some reason the PET scan is negative, then I would refer to Surgery.  I suspect that he probably had a laparoscopic approach initially to see if there is any metastatic disease within the abdomen.  It is not, then I am sure he could have this tumor resected.  By the endoscopic ultrasound, there is no obvious invasion of the superior mesenteric artery or the celiac artery.  I think this is very favorable for resection.  I am worried about the weight loss.  His appetite is not all that good.  I did give him some samples of Creon.  Hopefully, this might help with his pancreas and help with his digestion.  I spent a good hour or so with Mr. Litsey and his family.  I answered all their questions.  I told them that if this cancer has spread to the liver, that we could treat this, but not cure this.  They understand this.  I will not go over prognosis with respect to life span until we have a better idea as to whether or not this is metastatic or not.  We will call him with the results of the PET scan.  This will really dictate when he comes back to see [place].    ______________________________ Josph Macho, M.D. PRE/MEDQ  D:  01/05/2013  T:  01/06/2013  Job:  [id number]

## 2013-01-11 ENCOUNTER — Encounter (HOSPITAL_COMMUNITY)
Admission: RE | Admit: 2013-01-11 | Discharge: 2013-01-11 | Disposition: A | Discharge status: Home / Self Care | Payer: Medicare Other | Source: Ambulatory Visit | Attending: Hematology & Oncology | Admitting: Hematology & Oncology

## 2013-01-11 DIAGNOSIS — C259 Malignant neoplasm of pancreas, unspecified: Secondary | ICD-10-CM | POA: Insufficient documentation

## 2013-01-11 MED ORDER — FLUDEOXYGLUCOSE F - 18 (FDG) INJECTION
18.0000 | Freq: Once | INTRAVENOUS | Status: AC | PRN
  Administered 2013-01-11: 18 via INTRAVENOUS

## 2013-01-14 ENCOUNTER — Telehealth: Payer: Self-pay | Admitting: Hematology & Oncology

## 2013-01-14 NOTE — Telephone Encounter (Signed)
Pt aware of 7-17 appointment °

## 2013-01-15 ENCOUNTER — Ambulatory Visit (HOSPITAL_BASED_OUTPATIENT_CLINIC_OR_DEPARTMENT_OTHER): Payer: Medicare Other | Admitting: Hematology & Oncology

## 2013-01-15 ENCOUNTER — Other Ambulatory Visit: Payer: Medicare Other | Admitting: Lab

## 2013-01-15 VITALS — BP 122/68 | HR 73 | Temp 97.5°F | Resp 18 | Ht 66.0 in | Wt 135.0 lb

## 2013-01-15 DIAGNOSIS — C259 Malignant neoplasm of pancreas, unspecified: Secondary | ICD-10-CM

## 2013-01-15 DIAGNOSIS — C787 Secondary malignant neoplasm of liver and intrahepatic bile duct: Secondary | ICD-10-CM

## 2013-01-15 NOTE — Progress Notes (Signed)
This office note has been dictated.

## 2013-01-15 NOTE — Patient Instructions (Addendum)
Gemcitabine injection  What is this medicine?  GEMCITABINE (jem SIT a been) is a chemotherapy drug. This medicine is used to treat many types of cancer like breast cancer, lung cancer, pancreatic cancer, and ovarian cancer.  This medicine may be used for other purposes; ask your health care provider or pharmacist if you have questions.  What should I tell my health care provider before I take this medicine?  They need to know if you have any of these conditions:  -blood disorders  -infection  -kidney disease  -liver disease  -recent or ongoing radiation therapy  -an unusual or allergic reaction to gemcitabine, other chemotherapy, other medicines, foods, dyes, or preservatives  -pregnant or trying to get pregnant  -breast-feeding  How should I use this medicine?  This drug is given as an infusion into a vein. It is administered in a hospital or clinic by a specially trained health care professional.  Talk to your pediatrician regarding the use of this medicine in children. Special care may be needed.  Overdosage: If you think you have taken too much of this medicine contact a poison control center or emergency room at once.  NOTE: This medicine is only for you. Do not share this medicine with others.  What if I miss a dose?  It is important not to miss your dose. Call your doctor or health care professional if you are unable to keep an appointment.  What may interact with this medicine?  -medicines to increase blood counts like filgrastim, pegfilgrastim, sargramostim  -some other chemotherapy drugs like cisplatin  -vaccines  Talk to your doctor or health care professional before taking any of these medicines:  -acetaminophen  -aspirin  -ibuprofen  -ketoprofen  -naproxen  This list may not describe all possible interactions. Give your health care provider a list of all the medicines, herbs, non-prescription drugs, or dietary supplements you use. Also tell them if you smoke, drink alcohol, or use illegal drugs. Some  items may interact with your medicine.  What should I watch for while using this medicine?  Visit your doctor for checks on your progress. This drug may make you feel generally unwell. This is not uncommon, as chemotherapy can affect healthy cells as well as cancer cells. Report any side effects. Continue your course of treatment even though you feel ill unless your doctor tells you to stop.  In some cases, you may be given additional medicines to help with side effects. Follow all directions for their use.  Call your doctor or health care professional for advice if you get a fever, chills or sore throat, or other symptoms of a cold or flu. Do not treat yourself. This drug decreases your body's ability to fight infections. Try to avoid being around people who are sick.  This medicine may increase your risk to bruise or bleed. Call your doctor or health care professional if you notice any unusual bleeding.  Be careful brushing and flossing your teeth or using a toothpick because you may get an infection or bleed more easily. If you have any dental work done, tell your dentist you are receiving this medicine.  Avoid taking products that contain aspirin, acetaminophen, ibuprofen, naproxen, or ketoprofen unless instructed by your doctor. These medicines may hide a fever.  Women should inform their doctor if they wish to become pregnant or think they might be pregnant. There is a potential for serious side effects to an unborn child. Talk to your health care professional or pharmacist for   more information. Do not breast-feed an infant while taking this medicine.  What side effects may I notice from receiving this medicine?  Side effects that you should report to your doctor or health care professional as soon as possible:  -allergic reactions like skin rash, itching or hives, swelling of the face, lips, or tongue  -low blood counts - this medicine may decrease the number of white blood cells, red blood cells and  platelets. You may be at increased risk for infections and bleeding.  -signs of infection - fever or chills, cough, sore throat, pain or difficulty passing urine  -signs of decreased platelets or bleeding - bruising, pinpoint red spots on the skin, black, tarry stools, blood in the urine  -signs of decreased red blood cells - unusually weak or tired, fainting spells, lightheadedness  -breathing problems  -chest pain  -mouth sores  -nausea and vomiting  -pain, swelling, redness at site where injected  -pain, tingling, numbness in the hands or feet  -stomach pain  -swelling of ankles, feet, hands  -unusual bleeding  Side effects that usually do not require medical attention (report to your doctor or health care professional if they continue or are bothersome):  -constipation  -diarrhea  -hair loss  -loss of appetite  -stomach upset  This list may not describe all possible side effects. Call your doctor for medical advice about side effects. You may report side effects to FDA at 1-800-FDA-1088.  Where should I keep my medicine?  This drug is given in a hospital or clinic and will not be stored at home.  NOTE: This sheet is a summary. It may not cover all possible information. If you have questions about this medicine, talk to your doctor, pharmacist, or health care provider.   2013, Elsevier/Gold Standard. (10/27/2007 6:45:54 PM)  Nanoparticle Albumin-Bound Paclitaxel injection  What is this medicine?  NANOPARTICLE ALBUMIN-BOUND PACLITAXEL (Na no PAHR ti kuhl al BYOO muhn-bound PAK li TAX el) is a chemotherapy drug. It targets fast dividing cells, like cancer cells, and causes these cells to die. This medicine is used to treat advanced breast cancer and advanced lung cancer.  This medicine may be used for other purposes; ask your health care provider or pharmacist if you have questions.  What should I tell my health care provider before I take this medicine?  They need to know if you have any of these  conditions:  -kidney disease  -liver disease  -low blood counts, like low platelets, red blood cells, or white blood cells  -recent or ongoing radiation therapy  -an unusual or allergic reaction to paclitaxel, albumin, other chemotherapy, other medicines, foods, dyes, or preservatives  -pregnant or trying to get pregnant  -breast-feeding  How should I use this medicine?  This drug is given as an infusion into a vein. It is administered in a hospital or clinic by a specially trained health care professional.  Talk to your pediatrician regarding the use of this medicine in children. Special care may be needed.  Overdosage: If you think you have taken too much of this medicine contact a poison control center or emergency room at once.  NOTE: This medicine is only for you. Do not share this medicine with others.  What if I miss a dose?  It is important not to miss your dose. Call your doctor or health care professional if you are unable to keep an appointment.  What may interact with this medicine?  This medicine may also interact with   the following medications:  -cyclosporine  -dexamethasone  -diazepam  -ketoconazole  -medicines to increase blood counts like filgrastim, pegfilgrastim, sargramostim  -other chemotherapy drugs like cisplatin, doxorubicin, epirubicin, etoposide, teniposide, vincristine  -quinidine  -testosterone  -vaccines  -verapamil  Talk to your doctor or health care professional before taking any of these medicines:  -acetaminophen  -aspirin  -ibuprofen  -ketoprofen  -naproxen  This list may not describe all possible interactions. Give your health care provider a list of all the medicines, herbs, non-prescription drugs, or dietary supplements you use. Also tell them if you smoke, drink alcohol, or use illegal drugs. Some items may interact with your medicine.  What should I watch for while using this medicine?  Your condition will be monitored carefully while you are receiving this medicine. You will  need important blood work done while you are taking this medicine.  This drug may make you feel generally unwell. This is not uncommon, as chemotherapy can affect healthy cells as well as cancer cells. Report any side effects. Continue your course of treatment even though you feel ill unless your doctor tells you to stop.  In some cases, you may be given additional medicines to help with side effects. Follow all directions for their use.  Call your doctor or health care professional for advice if you get a fever, chills or sore throat, or other symptoms of a cold or flu. Do not treat yourself. This drug decreases your body's ability to fight infections. Try to avoid being around people who are sick.  This medicine may increase your risk to bruise or bleed. Call your doctor or health care professional if you notice any unusual bleeding.  Be careful brushing and flossing your teeth or using a toothpick because you may get an infection or bleed more easily. If you have any dental work done, tell your dentist you are receiving this medicine.  Avoid taking products that contain aspirin, acetaminophen, ibuprofen, naproxen, or ketoprofen unless instructed by your doctor. These medicines may hide a fever.  Do not become pregnant while taking this medicine. Women should inform their doctor if they wish to become pregnant or think they might be pregnant. There is a potential for serious side effects to an unborn child. Talk to your health care professional or pharmacist for more information. Do not breast-feed an infant while taking this medicine.  Men are advised not to father a child while receiving this medicine.  What side effects may I notice from receiving this medicine?  Side effects that you should report to your doctor or health care professional as soon as possible:  -allergic reactions like skin rash, itching or hives, swelling of the face, lips, or tongue  -low blood counts - This drug may decrease the number of  white blood cells, red blood cells and platelets. You may be at increased risk for infections and bleeding.  -signs of infection - fever or chills, cough, sore throat, pain or difficulty passing urine  -signs of decreased platelets or bleeding - bruising, pinpoint red spots on the skin, black, tarry stools, nosebleeds  -signs of decreased red blood cells - unusually weak or tired, fainting spells, lightheadedness  -breathing problems  -changes in vision  -chest pain  -high or low blood pressure  -mouth sores  -nausea and vomiting  -pain, swelling, redness or irritation at the injection site  -pain, tingling, numbness in the hands or feet  -slow or irregular heartbeat  -swelling of the ankle, feet, hands    Side effects that usually do not require medical attention (report to your doctor or health care professional if they continue or are bothersome):  -aches, pains  -changes in the color of fingernails  -diarrhea  -hair loss  -loss of appetite  This list may not describe all possible side effects. Call your doctor for medical advice about side effects. You may report side effects to FDA at 1-800-FDA-1088.  Where should I keep my medicine?  This drug is given in a hospital or clinic and will not be stored at home.  NOTE: This sheet is a summary. It may not cover all possible information. If you have questions about this medicine, talk to your doctor, pharmacist, or health care provider.   2013, [employer]vier/Gold Standard. (04/22/2011 4:13:49 PM)

## 2013-01-16 NOTE — Progress Notes (Signed)
CC:   Stan Head. Cleta Alberts, M.D.  DIAGNOSIS:  Metastatic pancreatic cancer.  CURRENT THERAPY:  Observation.  INTERIM HISTORY:  I had Mr. Wojtkiewicz come in today so I could talk to him about the results that we got from our staging studies.  Unfortunately, he does have 2 hypermetabolic lesions in his liver.  The largest measures 2.3 cm.  The other measures 1.2 cm.  There is a hypermetabolic mass in the pancreas.  His CA-19-9 was also elevated at 504.  He is maintaining his weight pretty well.  He is eating okay.  His pre-albumin was 27.3.  He is not having much in the way of pain.  He is pretty active.  His overall performance status is ECOG 1.  I talked to him at length about the treatment options.  Since we are looking at metastatic disease, surgery is not an option.  I think that chemotherapy would be the best option for him.  Since he has a good performance status, I do not see any reason why he could not handle fairly aggressive chemotherapy.  I do, however, want to send him to [facility] to see if he would qualify for a clinical trial.  I think this would be a very reasonable approach for him.  I think that it would be good to get the opinion of [facility] and see if they do have a clinical trial that he might qualify for.  If, for some reason, he does not qualify for a clinical trial, then I would treat him with Gemzar/Abraxane.  I think this would be considered standard therapy for metastatic pancreatic cancer.  I really do not think that we need to biopsy these lesions.  They would be very tough to get to.  I think that the PET scan is very sensitive for detecting metastatic disease.  The fact that his CA19-9 is elevated as it is certainly goes along with metastatic disease.  Again, he is eating well.  He is having no problems going to the bathroom.  There is no diarrhea.  He has had no leg swelling.  He has not noted any rashes.  PHYSICAL EXAM:  General:  This is a fairly  well-developed, well- nourished white gentleman in no obvious distress.  Vital Signs:  Show a temperature of 97.5, pulse 73, respiratory rate 18, blood pressure 122/68.  Weight is 135.  Head and Neck:  Show a normocephalic, atraumatic skull.  He has no ocular or oral lesions.  There are no palpable cervical or supraclavicular lymph nodes.  Lungs:  Clear bilaterally.  Cardiac:  Regular rate and rhythm with a normal S1 and S2. There are no murmurs, rubs, or bruits.  Abdomen:  Soft with good bowel sounds.  There is no palpable abdominal mass.  There is no fluid wave. There is no palpable hepatosplenomegaly.  Extremities:  Show no clubbing, cyanosis, or edema. Neurological:  No focal neurological deficits.  LABS:  Not done this visit.  IMPRESSION:  Mr. Schaible is a very nice 67 year old African American gentleman with metastatic pancreatic cancer.  Again, he is in good shape.  Again, I want to see if he does qualify for a clinical trial.  I think he would be a good candidate for a clinical trial.  I do not see any reason why he would not qualify.  Again, if he does not qualify for a trial;, I would treat him with Gemzar and Abraxane.  I gave his family information sheets about the 2 medications.  I will not make an appointment for him as of yet until I know where things stand with [facility].  I spent a good 45 minutes with Mr. Haynesworth and his family.  As always, we had good fellowship.  He has an incredibly strong faith.    ______________________________ Josph Macho, M.D. PRE/MEDQ  D:  01/15/2013  T:  01/16/2013  Job:  [id number]

## 2013-01-22 ENCOUNTER — Encounter: Payer: Self-pay | Admitting: *Deleted

## 2013-01-22 NOTE — Progress Notes (Signed)
Called [facility] Radiology department to have patients scans put on a disc to take to [facility] (first floor radiology at [facility] [phone])   Also called [facility] on [address] to have scans put on disc.  Patient informed of this .  Was assured they would be ready on Monday.

## 2013-01-29 ENCOUNTER — Telehealth: Payer: Self-pay | Admitting: *Deleted

## 2013-01-29 NOTE — Telephone Encounter (Signed)
Spoke to pt. Will give message to Dr Myna Hidalgo. Attempted to obtain consult note from [facility] but it wasn't available through [facility]where.

## 2013-02-16 ENCOUNTER — Other Ambulatory Visit: Payer: Self-pay

## 2013-02-16 MED ORDER — AMLODIPINE BESYLATE 5 MG PO TABS: 5.0000 mg | ORAL_TABLET | Freq: Every morning | ORAL | Status: DC

## 2013-02-22 ENCOUNTER — Telehealth: Payer: Self-pay | Admitting: Hematology & Oncology

## 2013-02-22 NOTE — Telephone Encounter (Signed)
Daughter Janey Greaser came to pick up meds for father. She is on release and showed her ID. Per RN she does not have to sign for them. I gave them to her.

## 2013-02-24 ENCOUNTER — Other Ambulatory Visit: Payer: Self-pay | Admitting: Emergency Medicine

## 2013-06-25 ENCOUNTER — Other Ambulatory Visit: Payer: Self-pay | Admitting: Emergency Medicine

## 2013-06-29 DIAGNOSIS — H44111 Panuveitis, right eye: Secondary | ICD-10-CM

## 2013-06-29 DIAGNOSIS — H3320 Serous retinal detachment, unspecified eye: Secondary | ICD-10-CM

## 2013-06-29 HISTORY — DX: Panuveitis, right eye: H44.111

## 2013-06-29 HISTORY — DX: Serous retinal detachment, unspecified eye: H33.20

## 2013-08-16 ENCOUNTER — Telehealth: Payer: Self-pay

## 2013-08-16 NOTE — Telephone Encounter (Signed)
Dr Everlene Farrier called pt and pt gave him h/o his Sxs which have been going on for weeks and stopped meds 4 days ago. Pt agreed to come and see Dr Everlene Farrier tomorrow morning.

## 2013-08-16 NOTE — Telephone Encounter (Signed)
Dr Everlene Farrier, [facility] called and are sending pt in to see you today d/t bad weather and not wanting pt to drive to [facility]. They are sending records now and report that pt had 7th cycle of cancer treatment (clinical trial) last Wed and due for next this Wed. He reports inc loss of appetite and weakness and SOB w/exertion. Has not been eating much but has been drinking water. Pt has had problem w/anemia and is being transfused Q 4-5 weeks, due again this Wed. Last Wed Hb was 8.9. 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 Pt states that starting Nancy Fetter he has been having loose stools 3-4 x day every time he eats w/some green mucus today. [facility] has a concern for C-diff and also want to make sure his Hb and electrolytes are OK.

## 2013-08-17 ENCOUNTER — Encounter (HOSPITAL_COMMUNITY): Payer: Self-pay | Admitting: Emergency Medicine

## 2013-08-17 ENCOUNTER — Inpatient Hospital Stay (HOSPITAL_COMMUNITY)
Admission: EM | Admit: 2013-08-17 | Discharge: 2013-08-20 | DRG: 391 | Disposition: A | Discharge status: Home Health | Payer: Medicare Other | Attending: Internal Medicine | Admitting: Internal Medicine

## 2013-08-17 ENCOUNTER — Ambulatory Visit (INDEPENDENT_AMBULATORY_CARE_PROVIDER_SITE_OTHER): Payer: Medicare Other | Admitting: Emergency Medicine

## 2013-08-17 VITALS — BP 118/79 | HR 99 | Temp 97.7°F | Resp 16 | Wt 128.2 lb

## 2013-08-17 DIAGNOSIS — D649 Anemia, unspecified: Secondary | ICD-10-CM

## 2013-08-17 DIAGNOSIS — Z86718 Personal history of other venous thrombosis and embolism: Secondary | ICD-10-CM

## 2013-08-17 DIAGNOSIS — E43 Unspecified severe protein-calorie malnutrition: Secondary | ICD-10-CM | POA: Diagnosis present

## 2013-08-17 DIAGNOSIS — H43399 Other vitreous opacities, unspecified eye: Secondary | ICD-10-CM | POA: Diagnosis present

## 2013-08-17 DIAGNOSIS — H0520 Unspecified exophthalmos: Secondary | ICD-10-CM | POA: Diagnosis present

## 2013-08-17 DIAGNOSIS — M1990 Unspecified osteoarthritis, unspecified site: Secondary | ICD-10-CM

## 2013-08-17 DIAGNOSIS — R531 Weakness: Secondary | ICD-10-CM

## 2013-08-17 DIAGNOSIS — D6481 Anemia due to antineoplastic chemotherapy: Secondary | ICD-10-CM | POA: Diagnosis present

## 2013-08-17 DIAGNOSIS — B830 Visceral larva migrans: Secondary | ICD-10-CM

## 2013-08-17 DIAGNOSIS — H269 Unspecified cataract: Secondary | ICD-10-CM | POA: Diagnosis present

## 2013-08-17 DIAGNOSIS — B370 Candidal stomatitis: Secondary | ICD-10-CM | POA: Diagnosis present

## 2013-08-17 DIAGNOSIS — T451X5A Adverse effect of antineoplastic and immunosuppressive drugs, initial encounter: Secondary | ICD-10-CM | POA: Diagnosis present

## 2013-08-17 DIAGNOSIS — Z791 Long term (current) use of non-steroidal anti-inflammatories (NSAID): Secondary | ICD-10-CM

## 2013-08-17 DIAGNOSIS — R197 Diarrhea, unspecified: Principal | ICD-10-CM

## 2013-08-17 DIAGNOSIS — Z9221 Personal history of antineoplastic chemotherapy: Secondary | ICD-10-CM

## 2013-08-17 DIAGNOSIS — T3795XA Adverse effect of unspecified systemic anti-infective and antiparasitic, initial encounter: Secondary | ICD-10-CM | POA: Diagnosis present

## 2013-08-17 DIAGNOSIS — Z87891 Personal history of nicotine dependence: Secondary | ICD-10-CM

## 2013-08-17 DIAGNOSIS — C259 Malignant neoplasm of pancreas, unspecified: Secondary | ICD-10-CM

## 2013-08-17 DIAGNOSIS — H209 Unspecified iridocyclitis: Secondary | ICD-10-CM | POA: Diagnosis present

## 2013-08-17 DIAGNOSIS — E860 Dehydration: Secondary | ICD-10-CM

## 2013-08-17 DIAGNOSIS — E440 Moderate protein-calorie malnutrition: Secondary | ICD-10-CM

## 2013-08-17 DIAGNOSIS — R5383 Other fatigue: Secondary | ICD-10-CM

## 2013-08-17 DIAGNOSIS — R634 Abnormal weight loss: Secondary | ICD-10-CM

## 2013-08-17 DIAGNOSIS — I10 Essential (primary) hypertension: Secondary | ICD-10-CM | POA: Diagnosis present

## 2013-08-17 DIAGNOSIS — H02409 Unspecified ptosis of unspecified eyelid: Secondary | ICD-10-CM | POA: Diagnosis present

## 2013-08-17 DIAGNOSIS — Z79899 Other long term (current) drug therapy: Secondary | ICD-10-CM

## 2013-08-17 DIAGNOSIS — H30891 Other chorioretinal inflammations, right eye: Secondary | ICD-10-CM

## 2013-08-17 DIAGNOSIS — T368X5A Adverse effect of other systemic antibiotics, initial encounter: Secondary | ICD-10-CM | POA: Diagnosis present

## 2013-08-17 DIAGNOSIS — H3090 Unspecified chorioretinal inflammation, unspecified eye: Secondary | ICD-10-CM

## 2013-08-17 DIAGNOSIS — D899 Disorder involving the immune mechanism, unspecified: Secondary | ICD-10-CM | POA: Diagnosis present

## 2013-08-17 DIAGNOSIS — R5381 Other malaise: Secondary | ICD-10-CM

## 2013-08-17 DIAGNOSIS — E876 Hypokalemia: Secondary | ICD-10-CM | POA: Diagnosis present

## 2013-08-17 DIAGNOSIS — K859 Acute pancreatitis without necrosis or infection, unspecified: Secondary | ICD-10-CM

## 2013-08-17 DIAGNOSIS — H5010 Unspecified exotropia: Secondary | ICD-10-CM | POA: Diagnosis present

## 2013-08-17 DIAGNOSIS — M129 Arthropathy, unspecified: Secondary | ICD-10-CM | POA: Diagnosis present

## 2013-08-17 DIAGNOSIS — H3320 Serous retinal detachment, unspecified eye: Secondary | ICD-10-CM | POA: Diagnosis present

## 2013-08-17 DIAGNOSIS — IMO0002 Reserved for concepts with insufficient information to code with codable children: Secondary | ICD-10-CM

## 2013-08-17 DIAGNOSIS — B5801 Toxoplasma chorioretinitis: Secondary | ICD-10-CM

## 2013-08-17 DIAGNOSIS — D72819 Decreased white blood cell count, unspecified: Secondary | ICD-10-CM

## 2013-08-17 HISTORY — DX: Unspecified exotropia: H50.10

## 2013-08-17 HISTORY — DX: Unspecified cataract: H26.9

## 2013-08-17 HISTORY — DX: Serous retinal detachment, unspecified eye: H33.20

## 2013-08-17 HISTORY — DX: Acute embolism and thrombosis of unspecified deep veins of unspecified lower extremity: I82.409

## 2013-08-17 HISTORY — DX: Unspecified ptosis of unspecified eyelid: H02.409

## 2013-08-17 HISTORY — DX: Arthropathy, unspecified: M12.9

## 2013-08-17 HISTORY — DX: Unspecified exophthalmos: H05.20

## 2013-08-17 HISTORY — DX: Toxoplasma chorioretinitis: B58.01

## 2013-08-17 HISTORY — DX: Anemia, unspecified: D64.9

## 2013-08-17 HISTORY — DX: Acute pancreatitis without necrosis or infection, unspecified: K85.90

## 2013-08-17 HISTORY — DX: Polyneuropathy, unspecified: G62.9

## 2013-08-17 HISTORY — DX: Panuveitis, right eye: H44.111

## 2013-08-17 LAB — CBC WITH DIFFERENTIAL/PLATELET
Basophils Absolute: 0 10*3/uL (ref 0.0–0.1)
Basophils Relative: 0 % (ref 0–1)
Eosinophils Absolute: 0 10*3/uL (ref 0.0–0.7)
Eosinophils Relative: 0 % (ref 0–5)
HCT: 24 % — ABNORMAL LOW (ref 39.0–52.0)
Hemoglobin: 8.3 g/dL — ABNORMAL LOW (ref 13.0–17.0)
Lymphocytes Relative: 17 % (ref 12–46)
Lymphs Abs: 0.4 10*3/uL — ABNORMAL LOW (ref 0.7–4.0)
MCH: 32 pg (ref 26.0–34.0)
MCHC: 34.6 g/dL (ref 30.0–36.0)
MCV: 92.7 fL (ref 78.0–100.0)
Monocytes Absolute: 0.2 10*3/uL (ref 0.1–1.0)
Monocytes Relative: 9 % (ref 3–12)
Neutro Abs: 1.7 10*3/uL (ref 1.7–7.7)
Neutrophils Relative %: 73 % (ref 43–77)
Platelets: 151 10*3/uL (ref 150–400)
RBC: 2.59 MIL/uL — ABNORMAL LOW (ref 4.22–5.81)
RDW: 18.9 % — ABNORMAL HIGH (ref 11.5–15.5)
WBC: 2.4 10*3/uL — ABNORMAL LOW (ref 4.0–10.5)

## 2013-08-17 LAB — POCT CBC
GRANULOCYTE PERCENT: 72 % (ref 37–80)
HEMATOCRIT: 29.3 % — AB (ref 43.5–53.7)
HEMOGLOBIN: 9.1 g/dL — AB (ref 14.1–18.1)
Lymph, poc: 0.7 (ref 0.6–3.4)
MCH, POC: 30.3 pg (ref 27–31.2)
MCHC: 31.1 g/dL — AB (ref 31.8–35.4)
MCV: 97.6 fL — AB (ref 80–97)
MID (cbc): 0.2 (ref 0–0.9)
MPV: 7.6 fL (ref 0–99.8)
POC Granulocyte: 2.4 (ref 2–6.9)
POC LYMPH %: 21.7 % (ref 10–50)
POC MID %: 6.3 %M (ref 0–12)
Platelet Count, POC: 187 10*3/uL (ref 142–424)
RBC: 3 M/uL — AB (ref 4.69–6.13)
RDW, POC: 18.5 %
WBC: 3.4 10*3/uL — AB (ref 4.6–10.2)

## 2013-08-17 LAB — BASIC METABOLIC PANEL
BUN: 10 mg/dL (ref 6–23)
CHLORIDE: 98 meq/L (ref 96–112)
CO2: 25 meq/L (ref 19–32)
Calcium: 8.8 mg/dL (ref 8.4–10.5)
Creat: 0.96 mg/dL (ref 0.50–1.35)
GLUCOSE: 177 mg/dL — AB (ref 70–99)
POTASSIUM: 4 meq/L (ref 3.5–5.3)
Sodium: 134 mEq/L — ABNORMAL LOW (ref 135–145)

## 2013-08-17 LAB — URINALYSIS, ROUTINE W REFLEX MICROSCOPIC
Glucose, UA: NEGATIVE mg/dL
Hgb urine dipstick: NEGATIVE
Ketones, ur: NEGATIVE mg/dL
LEUKOCYTES UA: NEGATIVE
NITRITE: NEGATIVE
PH: 5.5 (ref 5.0–8.0)
Protein, ur: NEGATIVE mg/dL
Specific Gravity, Urine: 1.024 (ref 1.005–1.030)
UROBILINOGEN UA: 0.2 mg/dL (ref 0.0–1.0)

## 2013-08-17 LAB — COMPREHENSIVE METABOLIC PANEL
ALT: 67 U/L — ABNORMAL HIGH (ref 0–53)
AST: 37 U/L (ref 0–37)
Albumin: 2.8 g/dL — ABNORMAL LOW (ref 3.5–5.2)
Alkaline Phosphatase: 127 U/L — ABNORMAL HIGH (ref 39–117)
BUN: 9 mg/dL (ref 6–23)
CO2: 23 mEq/L (ref 19–32)
Calcium: 8.5 mg/dL (ref 8.4–10.5)
Chloride: 100 mEq/L (ref 96–112)
Creatinine, Ser: 0.81 mg/dL (ref 0.50–1.35)
GFR calc Af Amer: >90 mL/min (ref >90)
GFR calc non Af Amer: 89 mL/min — ABNORMAL LOW (ref >90)
Glucose, Bld: 139 mg/dL — ABNORMAL HIGH (ref 70–99)
Potassium: 4.1 mEq/L (ref 3.7–5.3)
Sodium: 135 mEq/L — ABNORMAL LOW (ref 137–147)
Total Bilirubin: 0.5 mg/dL (ref 0.3–1.2)
Total Protein: 5.5 g/dL — ABNORMAL LOW (ref 6.0–8.3)

## 2013-08-17 LAB — IFOBT (OCCULT BLOOD): IFOBT: NEGATIVE

## 2013-08-17 LAB — GLUCOSE, CAPILLARY: GLUCOSE-CAPILLARY: 129 mg/dL — AB (ref 70–99)

## 2013-08-17 LAB — OCCULT BLOOD, POC DEVICE: Fecal Occult Bld: NEGATIVE

## 2013-08-17 MED ORDER — ENOXAPARIN SODIUM 60 MG/0.6ML ~~LOC~~ SOLN
60.0000 mg | Freq: Two times a day (BID) | SUBCUTANEOUS | Status: DC
  Administered 2013-08-17 – 2013-08-19 (×4): 60 mg via SUBCUTANEOUS
  Filled 2013-08-17 (×5): qty 0.6

## 2013-08-17 MED ORDER — SODIUM CHLORIDE 0.9 % IV SOLN
INTRAVENOUS | Status: DC
  Administered 2013-08-17 – 2013-08-20 (×3): via INTRAVENOUS

## 2013-08-17 MED ORDER — METRONIDAZOLE 500 MG PO TABS
500.0000 mg | ORAL_TABLET | Freq: Three times a day (TID) | ORAL | Status: DC
  Filled 2013-08-17 (×5): qty 1

## 2013-08-17 MED ORDER — ONDANSETRON HCL 4 MG PO TABS: 4.0000 mg | ORAL_TABLET | Freq: Four times a day (QID) | ORAL | Status: DC | PRN

## 2013-08-17 MED ORDER — SACCHAROMYCES BOULARDII 250 MG PO CAPS
250.0000 mg | ORAL_CAPSULE | Freq: Two times a day (BID) | ORAL | Status: DC
  Administered 2013-08-18: 250 mg via ORAL
  Filled 2013-08-17 (×3): qty 1

## 2013-08-17 MED ORDER — ACETAMINOPHEN 650 MG RE SUPP: 650.0000 mg | Freq: Four times a day (QID) | RECTAL | Status: DC | PRN

## 2013-08-17 MED ORDER — CHOLECALCIFEROL 25 MCG (1000 UT) PO CAPS: 1.0000 | ORAL_CAPSULE | Freq: Every day | ORAL | Status: DC

## 2013-08-17 MED ORDER — OXYCODONE HCL 5 MG PO TABS
10.0000 mg | ORAL_TABLET | ORAL | Status: DC | PRN
  Filled 2013-08-17: qty 2

## 2013-08-17 MED ORDER — ATROPINE SULFATE 1 % OP SOLN
1.0000 [drp] | Freq: Two times a day (BID) | OPHTHALMIC | Status: DC
  Filled 2013-08-17: qty 2

## 2013-08-17 MED ORDER — PANCRELIPASE (LIP-PROT-AMYL) 12000-38000 UNITS PO CPEP
1.0000 | ORAL_CAPSULE | Freq: Three times a day (TID) | ORAL | Status: DC
  Administered 2013-08-18 (×2): 1 via ORAL
  Filled 2013-08-17 (×10): qty 1

## 2013-08-17 MED ORDER — NYSTATIN 100000 UNIT/ML MT SUSP
5.0000 mL | Freq: Four times a day (QID) | OROMUCOSAL | Status: DC
  Administered 2013-08-17 – 2013-08-20 (×8): 500000 [IU] via ORAL
  Filled 2013-08-17 (×14): qty 5

## 2013-08-17 MED ORDER — CLINDAMYCIN HCL 300 MG PO CAPS
300.0000 mg | ORAL_CAPSULE | Freq: Four times a day (QID) | ORAL | Status: DC
  Administered 2013-08-18: 300 mg via ORAL
  Filled 2013-08-17 (×6): qty 1

## 2013-08-17 MED ORDER — SULFAMETHOXAZOLE-TMP DS 800-160 MG PO TABS
1.0000 | ORAL_TABLET | Freq: Two times a day (BID) | ORAL | Status: DC
  Administered 2013-08-18: 1 via ORAL
  Filled 2013-08-17 (×4): qty 1

## 2013-08-17 MED ORDER — POTASSIUM CHLORIDE CRYS ER 20 MEQ PO TBCR
20.0000 meq | EXTENDED_RELEASE_TABLET | Freq: Two times a day (BID) | ORAL | Status: DC
  Administered 2013-08-18 – 2013-08-20 (×4): 20 meq via ORAL
  Filled 2013-08-17 (×7): qty 1

## 2013-08-17 MED ORDER — ALUM & MAG HYDROXIDE-SIMETH 200-200-20 MG/5ML PO SUSP: 30.0000 mL | Freq: Four times a day (QID) | ORAL | Status: DC | PRN

## 2013-08-17 MED ORDER — VITAMIN B-12 100 MCG PO TABS
100.0000 ug | ORAL_TABLET | Freq: Every day | ORAL | Status: DC
  Administered 2013-08-18: 100 ug via ORAL
  Filled 2013-08-17 (×3): qty 1

## 2013-08-17 MED ORDER — DIFLUPREDNATE 0.05 % OP EMUL: 1.0000 [drp] | Freq: Four times a day (QID) | OPHTHALMIC | Status: DC

## 2013-08-17 MED ORDER — ADULT MULTIVITAMIN W/MINERALS CH
1.0000 | ORAL_TABLET | Freq: Every day | ORAL | Status: DC
  Administered 2013-08-18: 1 via ORAL
  Filled 2013-08-17 (×3): qty 1

## 2013-08-17 MED ORDER — VITAMIN D3 25 MCG (1000 UNIT) PO TABS
1000.0000 [IU] | ORAL_TABLET | Freq: Every day | ORAL | Status: DC
  Administered 2013-08-18: 1000 [IU] via ORAL
  Filled 2013-08-17 (×3): qty 1

## 2013-08-17 MED ORDER — ENOXAPARIN SODIUM 60 MG/0.6ML ~~LOC~~ SOLN
50.0000 mg | Freq: Two times a day (BID) | SUBCUTANEOUS | Status: DC
  Filled 2013-08-17: qty 0.6

## 2013-08-17 MED ORDER — ACETAMINOPHEN 325 MG PO TABS: 650.0000 mg | ORAL_TABLET | Freq: Four times a day (QID) | ORAL | Status: DC | PRN

## 2013-08-17 MED ORDER — GATIFLOXACIN 0.5 % OP SOLN
1.0000 [drp] | Freq: Four times a day (QID) | OPHTHALMIC | Status: DC
  Filled 2013-08-17: qty 2.5

## 2013-08-17 MED ORDER — ONDANSETRON HCL 4 MG/2ML IJ SOLN
4.0000 mg | Freq: Four times a day (QID) | INTRAMUSCULAR | Status: DC | PRN
Start: 2013-08-17 — End: 2013-08-20

## 2013-08-17 NOTE — ED Notes (Signed)
Report called to floor.  Patient transported up by tech, Hassell Done.

## 2013-08-17 NOTE — H&P (Addendum)
History and Physical  Marcus Lang IDP:[id number] DOB: [birth year] DOA: 08/17/2013  Referring physician: Dr. Jola Schmidt PCP: Jenny Reichmann, MD  Oncologist: Dr. Abelina Bachelor  Chief Complaint: Weakness and fatigue   History of Present Illness: Marcus Lang is an 68 y.o. male with a PMH of metastatic pancreatic cancer, getting chemotherapy at [facility] through the [facility] clinical trial, last chemo given 08/11/13 (Gemzar and Abraxane, on ruxolitinib).  On 08/12/13, the patient began to feel fatigued with weakness, loss of appetite, but no nausea or vomiting.  On 08/15/13, the patient developed worsening diarrhea.  No melena or hematochezia.  Stools were "green and slimy" looking.  No associated fever or chills.  No loose stools since about 8:00 a.m. Eating makes diarrhea worse, no alleviating factors.  Has not tried any over the counter anti-diarrheals.  The patient has been on Bactrim and Clindamycin toxoplasmosis chorioretinitis, and there is a concern for clostridium difficile.  Has had decreased urine output over the past 24 hours, and there is some concern for dehydration.  Review of Systems: Constitutional: No fever, no chills;  Appetite diminished; + weight loss, no weight gain, + fatigue.  HEENT: No blurry vision, no diplopia, no pharyngitis, no dysphagia CV: No chest pain, no palpitations, no PND.  Resp: + SOB (chronic), + cough, no pleuritic pain. GI: no nausea, no vomiting, + diarrhea, no melena, no hematochezia, no constipation.  GU: No dysuria, no hematuria, no frequency, no urgency. MSK: no myalgias in lower legs, no arthralgias.  Neuro:  No headache, no focal neurological deficits, no history of seizures.  Psych: No depression, no anxiety.  Endo: No heat intolerance, no cold intolerance, no polyuria, no polydipsia  Skin: No rashes, no skin lesions.  Heme: No easy bruising.    Past Medical History Past Medical History  Diagnosis Date  . Hypertension   . Pancreas  carcinoma 12/30/2012  . Toxoplasmosis chorioretinitis of right eye   . Retinal detachment 06/29/13    OD  . 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 Bilateral cataracts   . Proptosis   . Ptosis   . Exotropia of right eye   . Anemia   . Arthropathy 04/07/12  . Acute pancreatitis 04/07/12     Past Surgical History Past Surgical History  Procedure Laterality Date  . Spine surgery  nov 2008  . Back surgery  jan 2009  . Neck surgery  2008  . Back surgery  2009  . Tonsillectomy  1966  . Eus N/A 12/30/2012    Procedure: ESOPHAGEAL ENDOSCOPIC ULTRASOUND (EUS) RADIAL;  Surgeon: Arta Silence, MD;  Location: [facility];  Service: Endoscopy;  Laterality: N/A;     Social History: History   Social History  . Marital Status: Married    Spouse Name: N/A    Number of Children: 4  . Years of Education: N/A   Occupational History  . Farming, post office, Architect    Social History Main Topics  . Smoking status: Former Smoker -- 1.00 packs/day for 40 years    Types: Cigarettes    Quit date: 03/20/2008  . Smokeless tobacco: Never Used  . Alcohol Use: No     Comment: hx pint week- quit July 2014  . Drug Use: No  . Sexual Activity: Yes   Other Topics Concern  . Not on file   Social History Narrative   Married. Lives with wife in [place].  Independent with ambulation and ADLs.    Family History:  Family  History  Problem Relation Age of Onset  . Cancer Father     Esophageal  . Diabetes Brother   . Alzheimer's disease Mother     Allergies: Review of patient's allergies indicates no known allergies.  Meds: Prior to Admission medications   Medication Sig Start Date End Date Taking? Authorizing Provider  atropine 1 % ophthalmic solution Place 1 drop into both eyes 2 (two) times daily.   Yes Historical Provider, MD  Cholecalciferol (D 1000) 1000 UNITS capsule Take 1 capsule by mouth daily. Take 1,000 Units by mouth daily.   Yes Historical Provider, MD  clindamycin  (CLEOCIN) 300 MG capsule Take 300 mg by mouth 4 (four) times daily.   Yes Historical Provider, MD  Difluprednate (DUREZOL) 0.05 % EMUL Place 1 drop into both eyes 4 (four) times daily.   Yes Historical Provider, MD  enoxaparin (LOVENOX) 60 MG/0.6ML injection Inject 50 mg into the skin every 12 (twelve) hours.   Yes Historical Provider, MD  furosemide (LASIX) 20 MG tablet Take 20 mg by mouth daily.   Yes Historical Provider, MD  INVESTIGATIONAL DRUG SIMPLE RECORD [place] ruxolitinib 5 mg (Incyte INCB 25427-062) eIRB # [id number] 2 tablets bid 12 hours apart. for investigational use only. No grapefruit.   Yes Historical Provider, MD  lipase/protease/amylase (CREON-12/PANCREASE) 12000 UNITS CPEP capsule Take 1 capsule by mouth 3 (three) times daily before meals.   Yes Historical Provider, MD  moxifloxacin (VIGAMOX) 0.5 % ophthalmic solution Place into the right eye 4 (four) times daily.   Yes Historical Provider, MD  Multiple Vitamin (MULTIVITAMIN WITH MINERALS) TABS Take 1 tablet by mouth daily.   Yes Historical Provider, MD  ondansetron (ZOFRAN) 8 MG tablet Take 8 mg by mouth every 8 (eight) hours as needed for nausea or vomiting.   Yes Historical Provider, MD  oxyCODONE (OXY IR/ROXICODONE) 5 MG immediate release tablet Take 10 mg by mouth every 4 (four) hours as needed for severe pain (pain.).   Yes Historical Provider, MD  pantoprazole (PROTONIX) 40 MG tablet Take 40 mg by mouth daily.   Yes Historical Provider, MD  potassium chloride SA (K-DUR,KLOR-CON) 20 MEQ tablet Take 20 mEq by mouth 2 (two) times daily.   Yes Historical Provider, MD  sulfamethoxazole-trimethoprim (BACTRIM DS) 800-160 MG per tablet Take 1 tablet by mouth 2 (two) times daily.   Yes Historical Provider, MD  vitamin B-12 (CYANOCOBALAMIN) 100 MCG tablet Take 1 tablet by mouth daily. Take 100 mcg by mouth daily.   Yes Historical Provider, MD    Physical Exam: Filed Vitals:   08/17/13 1301 08/17/13 1411 08/17/13 1417  BP: 133/55 117/75  117/74  Pulse: 54 86 70  Temp: 97.6 F (36.4 C)    TempSrc: Oral    Resp: 18    SpO2: 99%       Physical Exam: Blood pressure 117/74, pulse 70, temperature 97.6 F (36.4 C), temperature source Oral, resp. rate 18, SpO2 99.00%. Gen: No acute distress. Head: Normocephalic, atraumatic. Eyes: PERRL, EOMI, sclerae nonicteric. Mouth: Oropharynx reveals poor dentition with thrush. Neck: Supple, no thyromegaly, no lymphadenopathy, no jugular venous distention. Chest: Lungs clear to auscultation bilaterally. CV: Heart sounds are regular. No murmurs, rubs, or gallops. Abdomen: Soft, nontender, nondistended with normal active bowel sounds. Extremities: Extremities are without clubbing, edema, or cyanosis. Skin: Warm and dry. Neuro: Alert and oriented times 3; cranial nerves II through XII grossly intact. Psych: Mood and affect normal.  Labs on Admission:  Basic Metabolic Panel:  Recent Labs Lab  08/17/13 1005 08/17/13 1400  NA 134* 135*  K 4.0 4.1  CL 98 100  CO2 25 23  GLUCOSE 177* 139*  BUN 10 9  CREATININE 0.96 0.81  CALCIUM 8.8 8.5   Liver Function Tests:  Recent Labs Lab 08/17/13 1400  AST 37  ALT 67*  ALKPHOS 127*  BILITOT 0.5  PROT 5.5*  ALBUMIN 2.8*   CBC:  Recent Labs Lab 08/17/13 1006 08/17/13 1400  WBC 3.4* 2.4*  NEUTROABS  --  1.7  HGB 9.1* 8.3*  HCT 29.3* 24.0*  MCV 97.6* 92.7  PLT  --  151   CBG:  Recent Labs Lab 08/17/13 1304  GLUCAP 129*    Radiological Exams on Admission: No results found.  EKG: Independently reviewed. Sinus rhythm at 80 beats per minute.  Borderline left axis deviation.  Assessment/Plan Principal Problem:   Diarrhea May be chemotherapy related but with ongoing treatment of Septra and clindamycin, need to rule out Clostridium difficile. We'll continue these antibiotics for now, place on empiric Florastor and Flagyl pending Clostridium difficile and GI pathogens profile. Active Problems:   Hypertension Hold  Lasix given diarrhea.   Normocytic Anemia Likely the sequela of prior chemotherapy as well as anemia of chronic disease. No current indication for transfusion.   Pancreas carcinoma We'll notify Dr. Marin Olp of the patient's admission. Has not taken his ruxolitinib in over one week, thinking it has made him sick. He is status post recent chemotherapy with Gemzar and Abraxane.   Dehydration BUN/creatinine ratio reassuring. Hydrate.   Leukopenia Not currently neutropenic. Watch counts closely.   Toxoplasmosis chorioretinitis of right eye Spoke with Dr. Baxter Flattery who will see the patient in consultation 08/18/13. This typically requires a prolonged treatment course up to a year. This time, we'll continue Septra/clindamycin, but may need to suspend therapy if C. difficile PCR studies are positive.   Generalized weakness Physical therapy evaluation requested.   DVT prophylaxis On therapeutic dose Lovenox for history of DVT in the past.  Code Status: Full. Family Communication: Joycelyn Schmid (wife) [phone] is emergency contact. Disposition Plan: Home when stable.  Time spent: 70 minutes.  RAMA,CHRISTINA [facility] Pager [phone] Cell: [phone]   If 7PM-7AM, please contact night-coverage [email or web address] Password [email or web address] 08/17/2013, 5:09 PM    **Disclaimer: This note was dictated with voice recognition software. Similar sounding words can inadvertently be transcribed and this note may contain transcription errors which may not have been corrected upon publication of note.**

## 2013-08-17 NOTE — ED Provider Notes (Signed)
Medical screening examination/treatment/procedure(s) were conducted as a shared visit with non-physician practitioner(s) and myself.  I personally evaluated the patient during the encounter.  EKG Interpretation    Date/Time:  Tuesday August 17 2013 14:10:00 EST Ventricular Rate:  80 PR Interval:  145 QRS Duration: 93 QT Interval:  394 QTC Calculation: 454 R Axis:   -15 Text Interpretation:  Sinus rhythm Borderline left axis deviation Baseline wander in lead(s) V2 V3 No significant change was found Confirmed by Castella Lerner  MD, Talibah Colasurdo ([id number]) on 08/17/2013 3:22:59 PM            Admit for hydration and serial CBCs  Hoy Morn, MD 08/17/13 1614

## 2013-08-17 NOTE — ED Notes (Signed)
CBG 129 

## 2013-08-17 NOTE — ED Notes (Signed)
Bed: WA20 Expected date:  Expected time:  Means of arrival:  Comments: EMS-weakness 

## 2013-08-17 NOTE — ED Notes (Signed)
Pt sts he feels a lil wobble when he stands but sts it is better than this morning where he felt completely weak and unable to stand without assistance.

## 2013-08-17 NOTE — Progress Notes (Signed)
Called for report at 1703 and 1719.

## 2013-08-17 NOTE — Progress Notes (Signed)
   Subjective:    Patient ID: Marcus Lang, male    DOB: March 08, 1946, 68 y.o.   MRN: [id number]  HPI patient currently undergoing treatment for pancreatic cancer. He is followed at [facility] at the cancer center there. His primary physician is Dr. Bearl Mulberry. He is currently on clinical trial with gemcitabine, Abraxane, and ruxolitnib. He has been doing well except for complications which involved a clot in the right brachiocephalic vein and toxoplasmosis involving the right which is currently under treatment. He has been on clindamycin and Bactrim. The end of last week he started having trouble with nausea vomiting and frequent loose stools. His stools have had some mucus and blood in them. He has been unable to keep down food or liquids. He presents today with concerns about Clostridium difficile superinfection. He has not urinated since last night.    Review of Systems     Objective:   Physical Exam patient is alert and cooperative. He is definitely pale to exam. He has an exotropia and ptosis of the right eye. His neck is supple. He has a access port in the right anterior chest. His lungs are clear. His heart is regular rate without murmurs rubs or gallops. The abdomen is flat liver and spleen not enlarged there are no masses and no areas of significant tenderness . He has 1+ edema of both lower extremities. He has no calf tenderness .   Results for orders placed in visit on 08/17/13  POCT CBC      Result Value Ref Range   WBC 3.4 (*) 4.6 - 10.2 K/uL   Lymph, poc 0.7  0.6 - 3.4   POC LYMPH PERCENT 21.7  10 - 50 %L   MID (cbc) 0.2  0 - 0.9   POC MID % 6.3  0 - 12 %M   POC Granulocyte 2.4  2 - 6.9   Granulocyte percent 72.0  37 - 80 %G   RBC 3.00 (*) 4.69 - 6.13 M/uL   Hemoglobin 9.1 (*) 14.1 - 18.1 g/dL   HCT, POC 29.3 (*) 43.5 - 53.7 %   MCV 97.6 (*) 80 - 97 fL   MCH, POC 30.3  27 - 31.2 pg   MCHC 31.1 (*) 31.8 - 35.4 g/dL   RDW, POC 18.5     Platelet Count, POC 187  142 -  424 K/uL   MPV 7.6  0 - 99.8 fL  Hemossure neg     Assessment & Plan:     Will try and admit for hydration further evaluation of possible Clostridium difficile. He was given 1 L of IV fluids and secondl liter was started he is with a hemoglobin of 9.1 and white count 3400 suspect secondary to his chemotherapy to His Bmet return prior to transfer and his BUN and creatinine were acceptable . Patient did not have any diarrhea while in the office so no stools were collected for C. Difficile, parasites or culture

## 2013-08-17 NOTE — ED Notes (Addendum)
Patient from [facility] with pancreatic cancer gets chemo on Wednesdays.  Today feels weaker than usual, unstable on his feet, shaky.  Denies nausea, vomiting.  [place] gave him a liter of fluid IV.

## 2013-08-17 NOTE — ED Provider Notes (Signed)
CSN: [id number]     Arrival date & time 08/17/13  1258 History   First MD Initiated Contact with Patient 08/17/13 1324     Chief Complaint  Patient presents with  . Fatigue     (Consider location/radiation/quality/duration/timing/severity/associated sxs/prior Treatment) HPI Patient presents emergency department with weakness and fatigue.  The patient, states, that he's been treated for pancreatic cancer with IV chemotherapy.  Patient, states, that he was seen at [facility] urgent care and given IV fluids.  Patient, states, that he does not have any nausea, vomiting, abdominal pain, headache, blurred vision, dizziness, rash, fever, chest pain, shortness of breath, back pain, dysuria, or syncope.  The patient, states, that nothing seems to make his condition, better or worse. Past Medical History  Diagnosis Date  . Hypertension   . Pancreas carcinoma 12/30/2012   Past Surgical History  Procedure Laterality Date  . Spine surgery  nov 2008  . Back surgery  jan 2009  . Neck surgery  2008  . Back surgery  2009  . Tonsillectomy  1966  . Eus N/A 12/30/2012    Procedure: ESOPHAGEAL ENDOSCOPIC ULTRASOUND (EUS) RADIAL;  Surgeon: Arta Silence, MD;  Location: [facility];  Service: Endoscopy;  Laterality: N/A;   Family History  Problem Relation Age of Onset  . Cancer Father    History  Substance Use Topics  . Smoking status: Former Smoker -- 1.00 packs/day for 40 years    Types: Cigarettes    Quit date: 03/20/2008  . Smokeless tobacco: Never Used  . Alcohol Use: Yes     Comment: pint week    Review of Systems  All other systems negative except as documented in the HPI. All pertinent positives and negatives as reviewed in the HPI.  Allergies  Review of patient's allergies indicates no known allergies.  Home Medications   Current Outpatient Rx  Name  Route  Sig  Dispense  Refill  . atropine 1 % ophthalmic solution   Both Eyes   Place 1 drop into both eyes 2 (two) times daily.        . Cholecalciferol (D 1000) 1000 UNITS capsule   Oral   Take 1 capsule by mouth daily. Take 1,000 Units by mouth daily.         . clindamycin (CLEOCIN) 300 MG capsule   Oral   Take 300 mg by mouth 4 (four) times daily.         . Difluprednate (DUREZOL) 0.05 % EMUL   Both Eyes   Place 1 drop into both eyes 4 (four) times daily.         Marland Kitchen enoxaparin (LOVENOX) 60 MG/0.6ML injection   Subcutaneous   Inject 50 mg into the skin every 12 (twelve) hours.         . furosemide (LASIX) 20 MG tablet   Oral   Take 20 mg by mouth daily.         Dicie Beam DRUG SIMPLE RECORD      [facility] ruxolitinib 5 mg (Incyte INCB 25366-440) eIRB # [id number] 2 tablets bid 12 hours apart. for investigational use only. No grapefruit.         Marland Kitchen lipase/protease/amylase (CREON-12/PANCREASE) 12000 UNITS CPEP capsule   Oral   Take 1 capsule by mouth 3 (three) times daily before meals.         . moxifloxacin (VIGAMOX) 0.5 % ophthalmic solution   Right Eye   Place into the right eye 4 (four) times daily.         Marland Kitchen  Multiple Vitamin (MULTIVITAMIN WITH MINERALS) TABS   Oral   Take 1 tablet by mouth daily.         . ondansetron (ZOFRAN) 8 MG tablet   Oral   Take 8 mg by mouth every 8 (eight) hours as needed for nausea or vomiting.         Marland Kitchen oxyCODONE (OXY IR/ROXICODONE) 5 MG immediate release tablet   Oral   Take 10 mg by mouth every 4 (four) hours as needed for severe pain (pain.).         Marland Kitchen pantoprazole (PROTONIX) 40 MG tablet   Oral   Take 40 mg by mouth daily.         . potassium chloride SA (K-DUR,KLOR-CON) 20 MEQ tablet   Oral   Take 20 mEq by mouth 2 (two) times daily.         Marland Kitchen sulfamethoxazole-trimethoprim (BACTRIM DS) 800-160 MG per tablet   Oral   Take 1 tablet by mouth 2 (two) times daily.         . vitamin B-12 (CYANOCOBALAMIN) 100 MCG tablet   Oral   Take 1 tablet by mouth daily. Take 100 mcg by mouth daily.          BP 117/74  Pulse 70  Temp(Src)  97.6 F (36.4 C) (Oral)  Resp 18  SpO2 99% Physical Exam  Nursing note and vitals reviewed. Constitutional: He is oriented to person, place, and time. He appears well-developed and well-nourished. No distress.  HENT:  Head: Normocephalic and atraumatic.  Mouth/Throat: Oropharynx is clear and moist.  Eyes: Pupils are equal, round, and reactive to light.  Neck: Normal range of motion. Neck supple.  Cardiovascular: Normal rate, regular rhythm and normal heart sounds.  Exam reveals no gallop and no friction rub.   No murmur heard. Pulmonary/Chest: Effort normal and breath sounds normal. No respiratory distress.  Abdominal: Soft. Bowel sounds are normal. He exhibits no distension. There is no tenderness. There is no guarding.  Neurological: He is alert and oriented to person, place, and time. He exhibits normal muscle tone. Coordination normal.  Skin: Skin is warm and dry. No rash noted. No erythema.    ED Course  Procedures (including critical care time) Labs Review Labs Reviewed  GLUCOSE, CAPILLARY - Abnormal; Notable for the following:    Glucose-Capillary 129 (*)    All other components within normal limits  CBC WITH DIFFERENTIAL - Abnormal; Notable for the following:    WBC 2.4 (*)    RBC 2.59 (*)    Hemoglobin 8.3 (*)    HCT 24.0 (*)    RDW 18.9 (*)    Lymphs Abs 0.4 (*)    All other components within normal limits  COMPREHENSIVE METABOLIC PANEL - Abnormal; Notable for the following:    Sodium 135 (*)    Glucose, Bld 139 (*)    Total Protein 5.5 (*)    Albumin 2.8 (*)    ALT 67 (*)    Alkaline Phosphatase 127 (*)    GFR calc non Af Amer 89 (*)    All other components within normal limits  URINALYSIS, ROUTINE W REFLEX MICROSCOPIC   Imaging Review No results found.  EKG Interpretation    Date/Time:  Tuesday August 17 2013 14:10:00 EST Ventricular Rate:  80 PR Interval:  145 QRS Duration: 93 QT Interval:  394 QTC Calculation: 454 R Axis:   -15 Text  Interpretation:  Sinus rhythm Borderline left axis deviation Baseline wander in lead(s)  V2 V3 No significant change was found Confirmed by CAMPOS  MD, KEVIN ([id number]) on 08/17/2013 3:22:59 PM           Patient will be admitted for further evaluation and care.  The patient has a hemoglobin of 8.3.1 earlier today.  Patient will need serial hemoglobins.  Patient sent from urgent care for admission.  Patient will need IV fluids and a C. difficile culture patient will be admitted by the, [place], PA-C 08/17/13 [phone]

## 2013-08-17 NOTE — ED Notes (Signed)
Pt made aware a urine sample is needed.  Pt given urinal and was ask to ring out once he has used it.

## 2013-08-17 NOTE — Progress Notes (Signed)
Utilization Review completed.  Sherby Moncayo RN CM  

## 2013-08-18 ENCOUNTER — Telehealth: Payer: Self-pay

## 2013-08-18 DIAGNOSIS — R634 Abnormal weight loss: Secondary | ICD-10-CM

## 2013-08-18 DIAGNOSIS — E43 Unspecified severe protein-calorie malnutrition: Secondary | ICD-10-CM | POA: Insufficient documentation

## 2013-08-18 DIAGNOSIS — C801 Malignant (primary) neoplasm, unspecified: Secondary | ICD-10-CM

## 2013-08-18 LAB — HEPATIC FUNCTION PANEL
ALT: 73 U/L — ABNORMAL HIGH (ref 0–53)
AST: 44 U/L — ABNORMAL HIGH (ref 0–37)
Albumin: 3.6 g/dL (ref 3.5–5.2)
Alkaline Phosphatase: 131 U/L — ABNORMAL HIGH (ref 39–117)
BILIRUBIN TOTAL: 0.8 mg/dL (ref 0.2–1.2)
Bilirubin, Direct: 0.2 mg/dL (ref 0.0–0.3)
Indirect Bilirubin: 0.6 mg/dL (ref 0.2–1.2)
Total Protein: 5.6 g/dL — ABNORMAL LOW (ref 6.0–8.3)

## 2013-08-18 LAB — GI PATHOGEN PANEL BY PCR, STOOL
C difficile toxin A/B: NEGATIVE
CAMPYLOBACTER BY PCR: NEGATIVE
Cryptosporidium by PCR: NEGATIVE
E COLI (STEC): NEGATIVE
E coli (ETEC) LT/ST: NEGATIVE
E coli 0157 by PCR: NEGATIVE
G lamblia by PCR: NEGATIVE
Norovirus GI/GII: NEGATIVE
Rotavirus A by PCR: NEGATIVE
Salmonella by PCR: NEGATIVE
Shigella by PCR: NEGATIVE

## 2013-08-18 LAB — PREPARE RBC (CROSSMATCH)

## 2013-08-18 LAB — CLOSTRIDIUM DIFFICILE BY PCR: Toxigenic C. Difficile by PCR: NEGATIVE

## 2013-08-18 LAB — BASIC METABOLIC PANEL
BUN: 9 mg/dL (ref 6–23)
CO2: 23 mEq/L (ref 19–32)
Calcium: 7.9 mg/dL — ABNORMAL LOW (ref 8.4–10.5)
Chloride: 102 mEq/L (ref 96–112)
Creatinine, Ser: 0.87 mg/dL (ref 0.50–1.35)
GFR calc Af Amer: >90 mL/min (ref >90)
GFR, EST NON AFRICAN AMERICAN: 86 mL/min — AB (ref >90)
Glucose, Bld: 137 mg/dL — ABNORMAL HIGH (ref 70–99)
Potassium: 3.4 mEq/L — ABNORMAL LOW (ref 3.7–5.3)
SODIUM: 136 meq/L — AB (ref 137–147)

## 2013-08-18 LAB — ABO/RH: ABO/RH(D): O POS

## 2013-08-18 LAB — LIPASE: Lipase: 20 U/L (ref 0–75)

## 2013-08-18 LAB — AMYLASE: AMYLASE: 28 U/L (ref 0–105)

## 2013-08-18 MED ORDER — BISMUTH SUBSALICYLATE 262 MG PO CHEW
524.0000 mg | CHEWABLE_TABLET | Freq: Three times a day (TID) | ORAL | Status: DC | PRN
  Filled 2013-08-18 (×3): qty 2

## 2013-08-18 MED ORDER — METRONIDAZOLE 500 MG PO TABS: 500.0000 mg | ORAL_TABLET | Freq: Three times a day (TID) | ORAL | Status: DC

## 2013-08-18 MED ORDER — METRONIDAZOLE IN NACL 5-0.79 MG/ML-% IV SOLN
500.0000 mg | Freq: Three times a day (TID) | INTRAVENOUS | Status: DC
  Administered 2013-08-18 – 2013-08-20 (×6): 500 mg via INTRAVENOUS
  Filled 2013-08-18 (×8): qty 100

## 2013-08-18 MED ORDER — LOPERAMIDE HCL 2 MG PO CAPS
2.0000 mg | ORAL_CAPSULE | ORAL | Status: DC | PRN
  Administered 2013-08-18 (×2): 2 mg via ORAL
  Filled 2013-08-18 (×2): qty 1

## 2013-08-18 MED ORDER — MORPHINE SULFATE 2 MG/ML IJ SOLN
1.0000 mg | Freq: Four times a day (QID) | INTRAMUSCULAR | Status: DC | PRN
  Administered 2013-08-18 – 2013-08-19 (×3): 1 mg via INTRAVENOUS
  Filled 2013-08-18 (×3): qty 1

## 2013-08-18 MED ORDER — GLUCERNA SHAKE PO LIQD
237.0000 mL | Freq: Two times a day (BID) | ORAL | Status: DC
  Administered 2013-08-19 – 2013-08-20 (×2): 237 mL via ORAL
  Filled 2013-08-18 (×5): qty 237

## 2013-08-18 NOTE — Progress Notes (Addendum)
[facility] PROGRESS NOTE  Marcus Lang BZJ:[id number] DOB: 1946/04/08 DOA: 08/17/2013 PCP: Jenny Reichmann, MD  Brief narrative: 68 y.o. male with a past medical history of metastatic pancreatic cancer, getting chemotherapy at [facility] through the [place] clinical trial, last chemo given 08/11/13 (Gemzar and Abraxane, on ruxolitinib), on Bactrim and Clindamycin toxoplasmosis chorioretinitis. Pt presented to [facility] ED 08/17/2013 with complaints of weakness, loss of appetite and worsening diarrhea.   Assessment/Plan:  Principal Problem:  Acute diarrhea  - perhaps due to chemotherapy or bactrim and clindamycin - appreciate ID consult, recommendation to check stool culture, fecal occult, and leukocyte; also to hold clinda and bactrim; started on flagyl - C. Diff negative Active Problems:  Hypertension  - BP 108/61 - continue to hold antihypertensives Anemia of chronic disease - secondary to history of malignancy and sequela of chemotherapy - Hgb 6.8 this am so will give 2 units PRBC today Pancreas carcinoma  - per oncology management  Dehydration  - continue to hydrate with IV fluids  Hypokalemia - repleted - follow up BMP in am Leukopenia  - not neutropenic Toxoplasmosis chorioretinitis of right eye  - appreciate ID following - d/c septra and clinda; started flagyl  Generalized weakness  - follow up PT evaluation  DVT prophylaxis  - on Lovenox sub Q   Code Status: Full.  Family Communication: no family at the bedside Disposition Plan: Home when stable   Leisa Lenz, MD  [facility] Pager [phone]  If 7PM-7AM, please contact night-coverage [email or web address] Password [email or web address] 08/18/2013, 8:04 AM   LOS: 1 day   Consultants:  ID  Procedures:  none  Antibiotics:  Flagyl 08/18/2013 -->  Was on septra and clinda which was d/c'ed by ID 2/18  HPI/Subjective: No overnight events.   Objective: Filed Vitals:   08/17/13 1417 08/17/13 1815 08/17/13 2325  08/18/13 0648  BP: 117/74 118/54 119/60 112/61  Pulse: 70 89 87 86  Temp:  98 F (36.7 C) 98.2 F (36.8 C) 98.1 F (36.7 C)  TempSrc:  Oral Oral Oral  Resp:  18 16 16   Height:  5\' 8"  (1.727 m)    Weight:  58.06 kg (128 lb)    SpO2:  99% 100% 100%    Intake/Output Summary (Last 24 hours) at 08/18/13 0804 Last data filed at 08/17/13 2000  Gross per 24 hour  Intake    240 ml  Output    300 ml  Net    -60 ml    Exam:   General:  Pt is alert, follows commands appropriately, not in acute distress  Cardiovascular: Regular rate and rhythm, S1/S2, no murmurs, no rubs, no gallops  Respiratory: Clear to auscultation bilaterally, no wheezing, no crackles, no rhonchi  Abdomen: Soft, non tender, non distended, bowel sounds present, no guarding  Extremities: No edema, pulses DP and PT palpable bilaterally  Neuro: Grossly nonfocal  Data Reviewed: Basic Metabolic Panel:  Recent Labs Lab 08/17/13 1005 08/17/13 1400 08/18/13 0350  NA 134* 135* 136*  K 4.0 4.1 3.4*  CL 98 100 102  CO2 25 23 23   GLUCOSE 177* 139* 137*  BUN 10 9 9   CREATININE 0.96 0.81 0.87  CALCIUM 8.8 8.5 7.9*   Liver Function Tests:  Recent Labs Lab 08/17/13 1142 08/17/13 1400  AST 44* 37  ALT 73* 67*  ALKPHOS 131* 127*  BILITOT 0.8 0.5  PROT 5.6* 5.5*  ALBUMIN 3.6 2.8*    Recent Labs Lab 08/17/13 1142  LIPASE 20  AMYLASE 28  No results found for this basename: AMMONIA,  in the last 168 hours CBC:  Recent Labs Lab 08/17/13 1006 08/17/13 1400 08/18/13 0350  WBC 3.4* 2.4* 2.6*  NEUTROABS  --  1.7  --   HGB 9.1* 8.3* 6.8*  HCT 29.3* 24.0* 20.1*  MCV 97.6* 92.7 93.5  PLT  --  151 122*   Cardiac Enzymes: No results found for this basename: CKTOTAL, CKMB, CKMBINDEX, TROPONINI,  in the last 168 hours BNP: No components found with this basename: POCBNP,  CBG:  Recent Labs Lab 08/17/13 1304  GLUCAP 129*    No results found for this or any previous visit (from the past 240  hour(s)).   Studies: No results found.  Scheduled Meds: . atropine  1 drop Both Eyes BID  . cholecalciferol  1,000 Units Oral Daily  . Difluprednate  1 drop Both Eyes QID  . enoxaparin (LOVENOX)  60 mg Subcutaneous Q12H  . gatifloxacin  1 drop Both Eyes QID  . lipase/protease/amylase  1 capsule Oral TID AC  . metroNIDAZOLE  500 mg IV 3 times per day  . multivitamin   1 tablet Oral Daily  . nystatin  5 mL Oral QID  . potassium chloride SA  20 mEq Oral BID  . saccharomyces boulardii  250 mg Oral BID  . vitamin B-12  100 mcg Oral Daily   Continuous Infusions: . sodium chloride 100 mL/hr at 08/17/13 1830

## 2013-08-18 NOTE — Progress Notes (Addendum)
CRITICAL VALUE ALERT  Critical value received:  Hgb 6.8  Date of notification:  2/18  Time of notification:  0430  Critical value read back:yes  Nurse who received alert:  Walden Field  MD notified (1st page):Schorr Time of first page:  0500  MD notified (2nd page):0510  Time of second page:  Responding MD:   Time MD responded:

## 2013-08-18 NOTE — Telephone Encounter (Signed)
Dr Everlene Farrier, Juluis Rainier. I called [place] and also reported that you had spoken to pt this morning and he was in good spirits and also gave her your message that Dr Doran Durand can call you on your cell if he would like to speak w/you. She stated she will give him the message and your # and he will probably call but will be later in the day.

## 2013-08-18 NOTE — Progress Notes (Signed)
INITIAL NUTRITION ASSESSMENT  DOCUMENTATION CODES Per approved criteria  -Severe malnutrition in the context of acute illness or injury  Pt meets criteria for severe MALNUTRITION in the context of acute illness as evidenced by <50% PO intake for >5 days, moderate muscle wasting and subcutaneous fat loss in upper arms, clavicle and occipital/temporal regions   INTERVENTION: -Recommend Glucerna Shake po BID, each supplement provides 220 kcal and 10 grams of protein -Recommend 2PM snack -Encouraged intake of soluble food items -Assisted pt with ordering lunch -Will continue to montior   NUTRITION DIAGNOSIS: Inadequate oral intake related to loose stools as evidenced by PO intake <75% for one week, unintentional wt loss.   Goal: Pt to meet >/= 90% of their estimated nutrition needs    Monitor:  Total protein/energy intake, labs, weights, GI profile  Reason for Assessment: MST   68 y.o. male  Admitting Dx: Diarrhea  ASSESSMENT: Marcus Lang is an 68 y.o. male with a PMH of metastatic pancreatic cancer, getting chemotherapy at [facility] through the [employer] clinical trial, last chemo given 08/11/13 (Gemzar and Abraxane, on ruxolitinib). On 08/12/13, the patient began to feel fatigued with weakness, loss of appetite, but no nausea or vomiting. On 08/15/13, the patient developed worsening diarrhea. No melena or hematochezia. Stools were "green and slimy" looking. No associated fever or chills. No loose stools since about 8:00 a.m. Eating makes diarrhea worse, no alleviating factors. Has not tried any over the counter anti-diarrheals  -Pt with ongoing loose stools for one week -Has good appetite; however 30 minutes after each meal, pt will experience loose stools. Pt has experienced 12 lbs weight loss that pt reported has occurred since Thursday 2/12. Previous medical records indicate that weight loss may have happened in past 5-6 months as hasn't weighed his reported usual weight  since before 11/2012. -Was drinking Boost at home but stopped d/t concern for causing loose stools. Was willing to try Glucerna shakes as this has shown to be tolerated in pancreatic cancer pts -Possible for C.diff -Pt reported eating >50% of dinner (roast beef/potatoes/broccoli)  last night, but experienced loose stools while eating and was not able to finish -Encouraged pt to consume bananas,oatmeal, potatoes, rice, applesauce to assist in forming stools. Has been eating applesauce with meds and was willing to consume oatmeal with breakfast -Assisted pt in ordering lunch menu -Pt also willing to incorporate high protein snacks to assist with weight gain -Has evident signs of muscle wasting and subcutaneous fat loss in upper arms, clavicle and occipital regions   Height: Ht Readings from Last 1 Encounters:  08/17/13 5\' 8"  (1.727 m)    Weight: Wt Readings from Last 1 Encounters:  08/17/13 128 lb (58.06 kg)    Ideal Body Weight: 154 lbs  % Ideal Body Weight: 83%  Wt Readings from Last 10 Encounters:  08/17/13 128 lb (58.06 kg)  08/17/13 128 lb 3.2 oz (58.151 kg)  01/15/13 135 lb (61.236 kg)  01/05/13 133 lb (60.328 kg)  12/22/12 136 lb 9.6 oz (61.961 kg)  08/11/12 144 lb (65.318 kg)  04/07/12 140 lb 9.6 oz (63.776 kg)  01/09/12 140 lb (63.504 kg)  01/01/12 142 lb (64.411 kg)  10/01/11 147 lb (66.679 kg)    Usual Body Weight: 140 lbs  % Usual Body Weight: 91%  BMI:  Body mass index is 19.47 kg/(m^2).  Estimated Nutritional Needs: Kcal: 3557-3220 Protein: 75-85 gram Fluid: >/=2000 ml/daily  Skin: WDL  Diet Order: General  EDUCATION NEEDS: -No education needs  identified at this time   Intake/Output Summary (Last 24 hours) at 08/18/13 1127 Last data filed at 08/18/13 0900  Gross per 24 hour  Intake    480 ml  Output    300 ml  Net    180 ml    Last BM: 2/18   Labs:   Recent Labs Lab 08/17/13 1005 08/17/13 1400 08/18/13 0350  NA 134* 135* 136*  K  4.0 4.1 3.4*  CL 98 100 102  CO2 25 23 23   BUN 10 9 9   CREATININE 0.96 0.81 0.87  CALCIUM 8.8 8.5 7.9*  GLUCOSE 177* 139* 137*    CBG (last 3)   Recent Labs  08/17/13 1304  GLUCAP 129*    Scheduled Meds: . atropine  1 drop Both Eyes BID  . cholecalciferol  1,000 Units Oral Daily  . clindamycin  300 mg Oral QID  . Difluprednate  1 drop Both Eyes QID  . enoxaparin (LOVENOX) injection  60 mg Subcutaneous Q12H  . feeding supplement (GLUCERNA SHAKE)  237 mL Oral BID WC  . gatifloxacin  1 drop Both Eyes QID  . lipase/protease/amylase  1 capsule Oral TID AC  . metroNIDAZOLE  500 mg Oral 3 times per day  . multivitamin with minerals  1 tablet Oral Daily  . nystatin  5 mL Oral QID  . potassium chloride SA  20 mEq Oral BID  . saccharomyces boulardii  250 mg Oral BID  . sulfamethoxazole-trimethoprim  1 tablet Oral BID  . vitamin B-12  100 mcg Oral Daily    Continuous Infusions: . sodium chloride 100 mL/hr at 08/17/13 1830    Past Medical History  Diagnosis Date  . Hypertension   . Pancreas carcinoma 12/30/2012  . Toxoplasmosis chorioretinitis of right eye   . Retinal detachment 06/29/13    OD  . 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 Bilateral cataracts   . Proptosis   . Ptosis   . Exotropia of right eye   . Anemia   . Arthropathy 04/07/12  . Acute pancreatitis 04/07/12  . DVT (deep venous thrombosis)   . Peripheral neuropathy     Past Surgical History  Procedure Laterality Date  . Spine surgery  nov 2008  . Back surgery  jan 2009  . Neck surgery  2008  . Back surgery  2009  . Tonsillectomy  1966  . Eus N/A 12/30/2012    Procedure: ESOPHAGEAL ENDOSCOPIC ULTRASOUND (EUS) RADIAL;  Surgeon: Arta Silence, MD;  Location: [facility] ENDOSCOPY;  Service: Endoscopy;  Laterality: N/A;    Atlee Abide MS RD LDN Clinical Dietitian RDEYC:[phone]'

## 2013-08-18 NOTE — Telephone Encounter (Signed)
Shawna from [place] where pt is in a clinical trial for tx of pancreatic cancer called to check to see if pt came in to see Dr Everlene Farrier yesterday. Reported that he had and that we sent to ED where he was admitted to [facility]. She requested all records from [place] and [facility] that we have access to for Dr there to review. Spoke w/Dr Everlene Farrier who printed off all records of pt's visit here and notes from [place] to date and asked me to fax to [facility]. Faxed w/confirmation.

## 2013-08-18 NOTE — Consult Note (Addendum)
[facility]  Total days of antibiotics 29        Day 29 bactrim (for toxo chorio)        Day 29 clindamycin ( for toxo chorio)        Day 2 metronidazole       Reason for Consult: diarrhea in immunocompromised host    Referring Physician: rama  Principal Problem:   Diarrhea Active Problems:   Hypertension   Pancreas carcinoma   Dehydration   Normocytic anemia   Leukopenia   Toxoplasmosis chorioretinitis of right eye   Generalized weakness   History of DVT (deep vein thrombosis)    HPI: Marcus Lang is a 68 y.o. male  with metastatic pancreatic cancer participating in [place] clinical trial with gemcitabine, abraxane, and ruxolitinib started in August 2014. followed by dr. Bearl Mulberry at [facility]. Last cycle of chemo on 08/11/13 at that time his wbc 3.6/hbg 8.9/plt 158. He was noted to having increasing difficulty with right eye vision in December 2014 c/w panuveitis. He was diagnosed with toxoplasmosis chorioretinitis of right eye by ophtho at [facility] with serology showing Toxo IgM negative, Toxo IgG, but toxo PCR+ on AC vitreous fluid. Initially prescribed mepron, but too cost-prohibitive, then switched to bactrim and clindamycin on 07/20/13, with plan to do for 6 wks of therapy (per records on [facility]). He was also recently diagnosed with thrombus within the right brachiocephalic vein on 1/61 started on enoxaparin. He was admitted on 08/18/13 for 7 days of diarrhea, onset occurs 30 min after eating, sans fever or abdominal pain, no blood in stoo,l no nausea or vomiting but does well fatigue plus loss of appetite. He has loast 10-12# in the last week due to diarrhea he believes. He is not known to have diarrhea with his prior chemo cycles. On admit, he was afebrile, nontoxic appearing with labs revealing WBC 2.4 with ANC 1/7, hgb 8.3 and plt 151. Infectious work up revealed cdiff pcr negative. Stool panel sent, and started empirically on metronidazole. No household  contacts with diarrhea, no well water, no recent travel. Regarding his vision, he feels that it is stable, not back to baseline. He reports unable to see medial aspect of visual field of right eye (? medial heminopsia of OD), he is desperate to find out cause of his diarrhea. He is stopping to take his antibiotics if this is causing his presentation.  ONCOLOGY HISTORY(per [facility] records) 1. 01/01/12 abdominal pain and weight loss 2. CT scan showed 2.3x1.9 pancreatic mass and suspicious liver lesions 3. EUS 1.8 x2.8 body mass invasion into splenic artery 4. Biopsy positive AC 5. CA19-9 504 6. PET hypermetabolic mass pancreas and two hypermetabolic liver lesions 7. 02/10/13 cycle 1 day 1 [facility] clinical trial gem/abraxane/ruxolitinib 8. 04/07/13 CT size of pancreatic mass decreased. Interval decrease of liver lesions.  9. 12/14 L eye floaters, retinal detachment, possible uveitis- noted 10. 06/29/13 evaluated for panuveitis 11. 05/15/14: dx with toxo chorioretinitis 12. 07/28/13 CT shows stable disease.  13. 07/28/13 thrombus within the right brachiocephalic vein   OPTHo HISTORY  . Retinal detachment OD  . Panuveitis of right eye - Right Eye  . Cataracts, bilateral  . Proptosis  . Ptosis of right eyelid  . Exotropia of right eye  . Toxoplasmosis chorioretinitis of right eye    Past Medical History  Diagnosis Date  . Hypertension   . Pancreas carcinoma 12/30/2012  . Toxoplasmosis chorioretinitis of right eye   . Retinal detachment 06/29/13  OD  . 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 Bilateral cataracts   . Proptosis   . Ptosis   . Exotropia of right eye   . Anemia   . Arthropathy 04/07/12  . Acute pancreatitis 04/07/12  . DVT (deep venous thrombosis)   . Peripheral neuropathy     Allergies: No Known Allergies  MEDICATIONS: . atropine  1 drop Both Eyes BID  . cholecalciferol  1,000 Units Oral Daily  . clindamycin  300 mg Oral QID  . Difluprednate  1 drop Both Eyes QID  .  enoxaparin (LOVENOX) injection  60 mg Subcutaneous Q12H  . feeding supplement (GLUCERNA SHAKE)  237 mL Oral BID WC  . gatifloxacin  1 drop Both Eyes QID  . lipase/protease/amylase  1 capsule Oral TID AC  . metroNIDAZOLE  500 mg Oral 3 times per day  . multivitamin with minerals  1 tablet Oral Daily  . nystatin  5 mL Oral QID  . potassium chloride SA  20 mEq Oral BID  . saccharomyces boulardii  250 mg Oral BID  . sulfamethoxazole-trimethoprim  1 tablet Oral BID  . vitamin B-12  100 mcg Oral Daily    History  Substance Use Topics  . Smoking status: Former Smoker -- 1.00 packs/day for 40 years    Types: Cigarettes    Quit date: 03/20/2008  . Smokeless tobacco: Never Used  . Alcohol Use: No     Comment: hx pint week- quit July 2014    Family History  Problem Relation Age of Onset  . Cancer Father     Esophageal  . Diabetes Brother   . Alzheimer's disease Mother     Review of Systems  Constitutional: positive for fatigue, and positive for weight loss.  Negative for fever, chills, diaphoresis, activity change, appetite change, fatigue and unexpected weight change.  HENT: Negative for congestion, sore throat, rhinorrhea, sneezing, trouble swallowing and sinus pressure.  Eyes: Negative for photophobia and visual disturbance.  Respiratory: Negative for cough, chest tightness, shortness of breath, wheezing and stridor.  Cardiovascular: Negative for chest pain, palpitations and leg swelling.  Gastrointestinal: positive for diarrhea. Negative for nausea, vomiting, abdominal pain,  constipation, blood in stool, abdominal distention and anal bleeding.  Genitourinary: Negative for dysuria, hematuria, flank pain and difficulty urinating.  Musculoskeletal: Negative for myalgias, back pain, joint swelling, arthralgias and gait problem.  Skin: Negative for color change, pallor, rash and wound.  Neurological: Negative for dizziness, tremors, weakness and light-headedness.  Hematological:  Negative for adenopathy. Does not bruise/bleed easily.  Psychiatric/Behavioral: Negative for behavioral problems, confusion, sleep disturbance, dysphoric mood, decreased concentration and agitation.     OBJECTIVE: Temp:  [97.6 F (36.4 C)-98.2 F (36.8 C)] 98.1 F (36.7 C) (02/18 0648) Pulse Rate:  [54-89] 86 (02/18 0648) Resp:  [16-18] 16 (02/18 0648) BP: (112-133)/(54-75) 112/61 mmHg (02/18 0648) SpO2:  [99 %-100 %] 100 % (02/18 0648) Weight:  [128 lb (58.06 kg)] 128 lb (58.06 kg) (02/17 1815)  Constitutional: He is oriented to person, place, and time. He appears well-developed and well-nourished. No distress.  HENT:  Mouth/Throat: Oropharynx is clear and moist. No oropharyngeal exudate.  Cardiovascular: Normal rate, regular rhythm and normal heart sounds. Exam reveals no gallop and no friction rub.  No murmur heard.  Pulmonary/Chest: Effort normal and breath sounds normal. No respiratory distress. He has no wheezes.  Abdominal: Soft. Bowel sounds are normal. He exhibits no distension. There is no tenderness.  Lymphadenopathy:  He has no cervical adenopathy.  Neurological: He is alert and oriented to person, place, and time.  Skin: Skin is warm and dry. No rash noted. No erythema.  Psychiatric: He has a normal mood and affect. His behavior is normal.    LABS: Results for orders placed during the hospital encounter of 08/17/13 (from the past 48 hour(s))  GLUCOSE, CAPILLARY     Status: Abnormal   Collection Time    08/17/13  1:04 PM      Result Value Ref Range   Glucose-Capillary 129 (*) 70 - 99 mg/dL  CBC WITH DIFFERENTIAL     Status: Abnormal   Collection Time    08/17/13  2:00 PM      Result Value Ref Range   WBC 2.4 (*) 4.0 - 10.5 K/uL   RBC 2.59 (*) 4.22 - 5.81 MIL/uL   Hemoglobin 8.3 (*) 13.0 - 17.0 g/dL   HCT 24.0 (*) 39.0 - 52.0 %   MCV 92.7  78.0 - 100.0 fL   MCH 32.0  26.0 - 34.0 pg   MCHC 34.6  30.0 - 36.0 g/dL   RDW 18.9 (*) 11.5 - 15.5 %   Platelets 151   150 - 400 K/uL   Neutrophils Relative % 73  43 - 77 %   Neutro Abs 1.7  1.7 - 7.7 K/uL   Lymphocytes Relative 17  12 - 46 %   Lymphs Abs 0.4 (*) 0.7 - 4.0 K/uL   Monocytes Relative 9  3 - 12 %   Monocytes Absolute 0.2  0.1 - 1.0 K/uL   Eosinophils Relative 0  0 - 5 %   Eosinophils Absolute 0.0  0.0 - 0.7 K/uL   Basophils Relative 0  0 - 1 %   Basophils Absolute 0.0  0.0 - 0.1 K/uL  COMPREHENSIVE METABOLIC PANEL     Status: Abnormal   Collection Time    08/17/13  2:00 PM      Result Value Ref Range   Sodium 135 (*) 137 - 147 mEq/L   Potassium 4.1  3.7 - 5.3 mEq/L   Chloride 100  96 - 112 mEq/L   CO2 23  19 - 32 mEq/L   Glucose, Bld 139 (*) 70 - 99 mg/dL   BUN 9  6 - 23 mg/dL   Creatinine, Ser 0.81  0.50 - 1.35 mg/dL   Calcium 8.5  8.4 - 10.5 mg/dL   Total Protein 5.5 (*) 6.0 - 8.3 g/dL   Albumin 2.8 (*) 3.5 - 5.2 g/dL   AST 37  0 - 37 U/L   ALT 67 (*) 0 - 53 U/L   Alkaline Phosphatase 127 (*) 39 - 117 U/L   Total Bilirubin 0.5  0.3 - 1.2 mg/dL   GFR calc non Af Amer 89 (*) >90 mL/min   GFR calc Af Amer >90  >90 mL/min   Comment: (NOTE)     The eGFR has been calculated using the CKD EPI equation.     This calculation has not been validated in all clinical situations.     eGFR's persistently <90 mL/min signify possible Chronic Kidney     Disease.  OCCULT BLOOD, POC DEVICE     Status: None   Collection Time    08/17/13  3:40 PM      Result Value Ref Range   Fecal Occult Bld NEGATIVE  NEGATIVE  CLOSTRIDIUM DIFFICILE BY PCR     Status: None   Collection Time    08/17/13  8:14 PM  Result Value Ref Range   C difficile by pcr NEGATIVE  NEGATIVE   Comment: Performed at [facility] MICROSCOPIC     Status: Abnormal   Collection Time    08/17/13  8:15 PM      Result Value Ref Range   Color, Urine AMBER (*) YELLOW   Comment: BIOCHEMICALS MAY BE AFFECTED BY COLOR   APPearance CLEAR  CLEAR   Specific Gravity, Urine 1.024  1.005 - 1.030     pH 5.5  5.0 - 8.0   Glucose, UA NEGATIVE  NEGATIVE mg/dL   Hgb urine dipstick NEGATIVE  NEGATIVE   Bilirubin Urine SMALL (*) NEGATIVE   Ketones, ur NEGATIVE  NEGATIVE mg/dL   Protein, ur NEGATIVE  NEGATIVE mg/dL   Urobilinogen, UA 0.2  0.0 - 1.0 mg/dL   Nitrite NEGATIVE  NEGATIVE   Leukocytes, UA NEGATIVE  NEGATIVE   Comment: MICROSCOPIC NOT DONE ON URINES WITH NEGATIVE PROTEIN, BLOOD, LEUKOCYTES, NITRITE, OR GLUCOSE <1000 mg/dL.  BASIC METABOLIC PANEL     Status: Abnormal   Collection Time    08/18/13  3:50 AM      Result Value Ref Range   Sodium 136 (*) 137 - 147 mEq/L   Potassium 3.4 (*) 3.7 - 5.3 mEq/L   Comment: DELTA CHECK NOTED     REPEATED TO VERIFY   Chloride 102  96 - 112 mEq/L   CO2 23  19 - 32 mEq/L   Glucose, Bld 137 (*) 70 - 99 mg/dL   BUN 9  6 - 23 mg/dL   Creatinine, Ser 0.87  0.50 - 1.35 mg/dL   Calcium 7.9 (*) 8.4 - 10.5 mg/dL   GFR calc non Af Amer 86 (*) >90 mL/min   GFR calc Af Amer >90  >90 mL/min   Comment: (NOTE)     The eGFR has been calculated using the CKD EPI equation.     This calculation has not been validated in all clinical situations.     eGFR's persistently <90 mL/min signify possible Chronic Kidney     Disease.  CBC     Status: Abnormal   Collection Time    08/18/13  3:50 AM      Result Value Ref Range   WBC 2.6 (*) 4.0 - 10.5 K/uL   Comment: WHITE COUNT CONFIRMED ON SMEAR     RARE NRBCs   RBC 2.15 (*) 4.22 - 5.81 MIL/uL   Hemoglobin 6.8 (*) 13.0 - 17.0 g/dL   Comment: REPEATED TO VERIFY     CRITICAL RESULT CALLED TO, READ BACK BY AND VERIFIED WITH:     NACHEMAPONG RN AT 0422 ON [id number] BY DLONG   HCT 20.1 (*) 39.0 - 52.0 %   MCV 93.5  78.0 - 100.0 fL   MCH 31.6  26.0 - 34.0 pg   MCHC 33.8  30.0 - 36.0 g/dL   RDW 19.0 (*) 11.5 - 15.5 %   Platelets 122 (*) 150 - 400 K/uL   Comment: PLATELET COUNT CONFIRMED BY SMEAR  PREPARE RBC (CROSSMATCH)     Status: None   Collection Time    08/18/13  5:30 AM      Result Value Ref Range    Order Confirmation ORDER PROCESSED BY BLOOD BANK    TYPE AND SCREEN     Status: None   Collection Time    08/18/13  6:42 AM      Result Value Ref Range   ABO/RH(D)  O POS     Antibody Screen NEG     Sample Expiration 08/21/2013     Unit Number [id number]     Blood Component Type RED CELLS,LR     Unit division 00     Status of Unit ALLOCATED     Transfusion Status OK TO TRANSFUSE     Crossmatch Result Compatible     Unit Number [id number]     Blood Component Type RED CELLS,LR     Unit division 00     Status of Unit ALLOCATED     Transfusion Status OK TO TRANSFUSE     Crossmatch Result Compatible    ABO/RH     Status: None   Collection Time    08/18/13  6:42 AM      Result Value Ref Range   ABO/RH(D) O POS    PREPARE RBC (CROSSMATCH)     Status: None   Collection Time    08/18/13  7:58 AM      Result Value Ref Range   Order Confirmation ORDER PROCESSED BY BLOOD BANK      MICRO: 2/17 cdiff negative 2/17 stool pathogen panel  Assessment/Plan:  68yo M with metastatic pancreatic ca who is currently on gemcitabine/abraxane/ruxolitinib las dosed on 08/11/13 and also being treated for toxoplasmosis chorioretinitis presents with acute diarrhea and decreased urine output and weight loss. ddx = cdiff, infectious, antibiotic associated diarrhea, malabsorption   toxo chorioretinitis = would temporarily discontinue with clindamycin 300 QID and bactrim DS 1 tab BID. Currently on 5th week of treatment  Acute Diarrhea = check stool culture, fecal occult, and leukocyte. will hold clinda and bactrim as possible causes of his diarrhea. Less likely to think it is CMV since would expect to have systemic symptoms of fever ,chills, nightsweats. Will check CMV VL. Will discontinue loperamide, but can give him pepto-bismal. If no improvement in the next day, would consider getting abd/pelvic CT to see if colitis is noted. If no improvement, may need to get gi consultation for CSY.  Mod- Severe  protein-caloric malnutrition = would get nutrition consultation to help improve oral intake.  Elzie Rings [place] [facility] [phone]

## 2013-08-18 NOTE — Consult Note (Signed)
#   [id number] IS CONSULT NOTE.  Pete e.

## 2013-08-18 NOTE — Progress Notes (Signed)
Pt refused all his meds except lovenox at hs. States he had discussed with MD at ED meds were giving him diaarhea.

## 2013-08-18 NOTE — Progress Notes (Signed)
Pt has a critical hgb level of 6.8. Order given to infuse 1 unit of blood.

## 2013-08-18 NOTE — Progress Notes (Addendum)
Pt. Complains of bilateral arm pain after receiving iv Flagyl. He wants the MD to assess the problem before taking any pain med. MD paged. Awaiting response. IV Morphine ordered. Pt agreeable to plan, will alert RN if any further pain with this treatment and will receive iv morphine before the flagyl is given.

## 2013-08-19 DIAGNOSIS — E440 Moderate protein-calorie malnutrition: Secondary | ICD-10-CM

## 2013-08-19 LAB — TYPE AND SCREEN
ABO/RH(D): O POS
Antibody Screen: NEGATIVE
Unit division: 0
Unit division: 0

## 2013-08-19 LAB — BASIC METABOLIC PANEL
BUN: 5 mg/dL — ABNORMAL LOW (ref 6–23)
CALCIUM: 8.2 mg/dL — AB (ref 8.4–10.5)
CHLORIDE: 104 meq/L (ref 96–112)
CO2: 23 meq/L (ref 19–32)
Creatinine, Ser: 0.84 mg/dL (ref 0.50–1.35)
GFR calc Af Amer: >90 mL/min (ref >90)
GFR calc non Af Amer: 87 mL/min — ABNORMAL LOW (ref >90)
Glucose, Bld: 136 mg/dL — ABNORMAL HIGH (ref 70–99)
Potassium: 3.3 mEq/L — ABNORMAL LOW (ref 3.7–5.3)
SODIUM: 139 meq/L (ref 137–147)

## 2013-08-19 LAB — CBC
HCT: 20.1 % — ABNORMAL LOW (ref 39.0–52.0)
HEMATOCRIT: 26.6 % — AB (ref 39.0–52.0)
Hemoglobin: 6.8 g/dL — CL (ref 13.0–17.0)
Hemoglobin: 9.2 g/dL — ABNORMAL LOW (ref 13.0–17.0)
MCH: 31.6 pg (ref 26.0–34.0)
MCH: 31.6 pg (ref 26.0–34.0)
MCHC: 33.8 g/dL (ref 30.0–36.0)
MCHC: 34.6 g/dL (ref 30.0–36.0)
MCV: 93.5 fL (ref 78.0–100.0)
MCV: 91.4 fL (ref 78.0–100.0)
PLATELETS: 119 10*3/uL — AB (ref 150–400)
Platelets: 122 10*3/uL — ABNORMAL LOW (ref 150–400)
RBC: 2.15 MIL/uL — AB (ref 4.22–5.81)
RBC: 2.91 MIL/uL — AB (ref 4.22–5.81)
RDW: 19 % — AB (ref 11.5–15.5)
RDW: 18.4 % — ABNORMAL HIGH (ref 11.5–15.5)
WBC: 2.6 10*3/uL — ABNORMAL LOW (ref 4.0–10.5)
WBC: 5.2 10*3/uL (ref 4.0–10.5)

## 2013-08-19 MED ORDER — ENOXAPARIN SODIUM 60 MG/0.6ML ~~LOC~~ SOLN
50.0000 mg | Freq: Two times a day (BID) | SUBCUTANEOUS | Status: DC
  Administered 2013-08-19 – 2013-08-20 (×2): 50 mg via SUBCUTANEOUS
  Filled 2013-08-19 (×3): qty 0.6

## 2013-08-19 NOTE — Progress Notes (Addendum)
[facility] PROGRESS NOTE  Marcus Lang JJO:[id number] DOB: 1946/02/06 DOA: 08/17/2013 PCP: Jenny Reichmann, MD  Brief narrative: 68 y.o. male with a past medical history of metastatic pancreatic cancer, getting chemotherapy at [facility] through the [place] clinical trial, last chemo given 08/11/13 (Gemzar and Abraxane, on ruxolitinib), on Bactrim and Clindamycin toxoplasmosis chorioretinitis. Pt presented to [facility] ED 08/17/2013 with complaints of weakness, loss of appetite and worsening diarrhea.   Assessment/Plan:   Principal Problem:  Acute diarrhea  - perhaps due to chemotherapy or bactrim and clindamycin  - appreciate ID consult, recommendation to check stool culture, fecal occult, and leukocyte - all pending ; also to hold clinda and bactrim; started on flagyl  - C. Diff negative  Active Problems:  Hypertension  - BP 132/87 - at goal Anemia of chronic disease  - secondary to history of malignancy and sequela of chemotherapy  - Hgb 6.8 on 2/18 so transfused  2 units PRBC 2/18 Pancreas carcinoma  - per oncology management  Dehydration  - continue to hydrate with IV fluids  Hypokalemia  - repleted today as well - follow up BMP in am  Leukopenia  - resolved  Toxoplasmosis chorioretinitis of right eye  - appreciate ID following  - d/c'ed septra and clinda; started flagyl  Generalized weakness  - follow up PT evaluation  DVT prophylaxis  - on Lovenox sub Q    Code Status: Full.  Family Communication: no family at the bedside  Disposition Plan: Home when stable   Consultants:  ID Oncology  Procedures:  none Antibiotics:  Flagyl 08/18/2013 -->  Was on septra and clinda which was d/c'ed by ID 2/18   Leisa Lenz, MD  [facility] Pager [phone]  If 7PM-7AM, please contact night-coverage [email or web address] Password [facility] 08/19/2013, 1:22 PM   LOS: 2 days    HPI/Subjective: Feels better this am.   Objective: Filed Vitals:   08/18/13 2255 08/18/13  2355 08/19/13 0649 08/19/13 0703  BP: 109/60 109/69 108/67 132/87  Pulse: 84 78 79 94  Temp: 98 F (36.7 C) 98.1 F (36.7 C) 98 F (36.7 C) 98.5 F (36.9 C)  TempSrc: Oral Oral Oral Oral  Resp:  16 18 18   Height:      Weight:      SpO2:  98% 98% 99%    Intake/Output Summary (Last 24 hours) at 08/19/13 1322 Last data filed at 08/19/13 0900  Gross per 24 hour  Intake    525 ml  Output    150 ml  Net    375 ml    Exam:   General:  Pt is alert, follows commands appropriately, not in acute distress  Cardiovascular: Regular rate and rhythm, S1/S2, no murmurs, no rubs, no gallops  Respiratory: Clear to auscultation bilaterally, no wheezing, no crackles, no rhonchi  Abdomen: Soft, non tender, non distended, bowel sounds present, no guarding  Extremities: No edema, pulses DP and PT palpable bilaterally  Neuro: Grossly nonfocal  Data Reviewed: Basic Metabolic Panel:  Recent Labs Lab 08/17/13 1005 08/17/13 1400 08/18/13 0350 08/19/13 0524  NA 134* 135* 136* 139  K 4.0 4.1 3.4* 3.3*  CL 98 100 102 104  CO2 25 23 23 23   GLUCOSE 177* 139* 137* 136*  BUN 10 9 9  5*  CREATININE 0.96 0.81 0.87 0.84  CALCIUM 8.8 8.5 7.9* 8.2*   Liver Function Tests:  Recent Labs Lab 08/17/13 1142 08/17/13 1400  AST 44* 37  ALT 73* 67*  ALKPHOS 131* 127*  BILITOT 0.8 0.5  PROT 5.6* 5.5*  ALBUMIN 3.6 2.8*    Recent Labs Lab 08/17/13 1142  LIPASE 20  AMYLASE 28   No results found for this basename: AMMONIA,  in the last 168 hours CBC:  Recent Labs Lab 08/17/13 1006 08/17/13 1400 08/18/13 0350 08/19/13 0524  WBC 3.4* 2.4* 2.6* 5.2  NEUTROABS  --  1.7  --   --   HGB 9.1* 8.3* 6.8* 9.2*  HCT 29.3* 24.0* 20.1* 26.6*  MCV 97.6* 92.7 93.5 91.4  PLT  --  151 122* 119*   Cardiac Enzymes: No results found for this basename: CKTOTAL, CKMB, CKMBINDEX, TROPONINI,  in the last 168 hours BNP: No components found with this basename: POCBNP,  CBG:  Recent Labs Lab  08/17/13 1304  GLUCAP 129*    Recent Results (from the past 240 hour(s))  CLOSTRIDIUM DIFFICILE BY PCR     Status: None   Collection Time    08/17/13  8:14 PM      Result Value Ref Range Status   C difficile by pcr NEGATIVE  NEGATIVE Final   Comment: Performed at [facility]     Studies: No results found.  Scheduled Meds: . atropine  1 drop Both Eyes BID  . cholecalciferol  1,000 Units Oral Daily  . Difluprednate  1 drop Both Eyes QID  . enoxaparin (LOVENOX) injection  60 mg Subcutaneous Q12H  . feeding supplement (GLUCERNA SHAKE)  237 mL Oral BID WC  . gatifloxacin  1 drop Both Eyes QID  . lipase/protease/amylase  1 capsule Oral TID AC  . metronidazole  500 mg Intravenous Q8H  . multivitamin with minerals  1 tablet Oral Daily  . nystatin  5 mL Oral QID  . potassium chloride SA  20 mEq Oral BID  . vitamin B-12  100 mcg Oral Daily   Continuous Infusions: . sodium chloride 100 mL/hr at 08/17/13 1830

## 2013-08-19 NOTE — Progress Notes (Signed)
Lovenox dose decreased to 50 mg SQ Q12h based on previous LMWH at [facility] Pager #: [phone] 3:29 PM 08/19/2013

## 2013-08-19 NOTE — Consult Note (Signed)
Marcus Lang, Marcus Lang NO.:  [id number]  MEDICAL RECORD NO.:  [id number]  LOCATION:  71                         FACILITY:  [facility]  PHYSICIAN:  Volanda Napoleon, M.D.  DATE OF BIRTH:  08-Apr-1946  DATE OF CONSULTATION: DATE OF DISCHARGE:                                CONSULTATION   REFERRING PHYSICIAN:  Dr. Leisa Lenz.  REASON FOR CONSULTATION: 1. Metastatic pancreatic cancer. 2. Likely infectious colitis. 3. 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  HISTORY OF PRESENT ILLNESS:  Marcus Lang is well known to me.  He is a nice 68 year old African American gentleman.  I initially saw him back in, I think, May or June 2014.  At that point in time, he was found to have pancreatic mass.  He had liver lesions.  He had a biopsy which showed adenocarcinoma of the pancreas.  He had an elevated CA-19-9.  He was referred to [facility].  He had a great performance status and I felt that he would be an excellent candidate for protocol therapy.  He was placed on protocol with gemcitabine/Abraxane/Ruxolitinib.  He appeared to respond.  His last CT scan showed stable disease, but yet less than when he first presented.  He began to have visual issues in December.  He underwent consultation with Ophthalmology at [facility].  The found he had toxoplasmosis.  He was placed on antibiotics for this.  He was found to have a thrombus in the right brachiocephalic vein in January.  This probably was related to his Port-A-Cath.  The Port-A-Cath is still in.  He is on Lovenox.  He has been on antibiotics.  He was on clindamycin, Bactrim.  He now has diarrhea.  This started a week ago.  He is not eating that much.  He has profuse diarrhea.  He was admitted.  His lab work when he came in showed a hemoglobin of 6.8.  White cell count of 2.6 and platelet count of 122.  He is on his chemotherapy weekly for 3 weeks on and 1 week off.  I think this is his week off.  He is not having any abdominal  pain.  He has had no nausea or vomiting. There has been no bleeding as far he can tell.  He has had a low-grade fever.  His stool was checked for blood on admission.  This was negative.  PAST MEDICAL HISTORY:  Remarkable for; 1. Pancreatic cancer. 2. Toxoplasmosis uveitis. 3. Past history of hypertension.  ALLERGIES:  None.  ADMISSION MEDICATIONS: 1. Atropine eye drops. 2. Clindamycin 300 mg p.o. q.i.d. 3. Durezol 0.05% drops four times a day. 4. Lovenox 60 mg subcu b.i.d. 5. Lasix 20 mg p.o. daily. 6. Ruxolitinib 10 mg p.o. b.i.d. 7. Creon tablets 12000 units t.i.d. 8. Vigamox eyedrops 0.5% q.i.d. 9. Oxycodone 10 mg p.o. q.4 hours p.r.n. 10.Protonix 40 mg p.o. daily. 11.Potassium 20 mEq p.o. b.i.d. 12.Bactrim DS 1 p.o. b.i.d. 13.Vitamin B12 daily.  SOCIAL HISTORY:  Negative for tobacco or alcohol use.  REVIEW OF SYSTEMS:  As stated previously.  PHYSICAL EXAMINATION:  GENERAL:  This is a fairly well-developed, well- nourished, African American gentleman, in no obvious distress. VITAL SIGNS:  Temperature of 98.1,  pulse 86, respiratory rate 16, and blood pressure 112/61. HEAD AND NECK:  Normocephalic, atraumatic skull.  He has some proptosis. There are no oral lesions.  There is no adenopathy in the neck. LUNGS:  Clear bilaterally. CARDIAC:  Regular rate and rhythm with normal S1, S2.  There are no murmurs, rubs, or bruits. ABDOMEN:  Soft.  He has good bowel sounds.  There is no fluid wave. There is no palpable hepatosplenomegaly. EXTREMITIES:  No clubbing, cyanosis, or edema. SKIN:  No rashes, ecchymosis, or petechia.  IMPRESSION:  Marcus Lang is a 67 year old African American gentleman with metastatic pancreatic cancer.  He is on protocol therapy.  He obviously is doing quite well.  He has this colitis now.  Once this spreads this likely is going to be infectious colitis.  He did have the C. diff done. We will see what shows.  He has been on antibiotics.  As such, I  probably would get him on some Flagyl.  I think this will be helpful.  He is on pancreatic enzyme replacement.  This certainly is necessary for him.  He is on Lovenox.  I will continue on Lovenox.  He needs 2 units of packed red blood cells.  We will go ahead and add an additional unit of blood.  This is not surprising given his protocol therapy.  We will certainly follow along.  I thank the hospitalist for helping [place] out.     Volanda Napoleon, M.D.     PRE/MEDQ  D:  08/18/2013  T:  08/18/2013  Job:  [id number]

## 2013-08-19 NOTE — Progress Notes (Signed)
Mr. Marcus Lang is doing better. His diarrhea is improving. All cultures and titers are negative.  He got 2 units of blood. He's not hurting. He feels better.  He's had no cough. No shortness of breath.  He says his go for a CT scan today.  His vital signs are stable. He is afebrile. Blood pressure 132/87. Lungs are clear. Cardiac exam irregular rhythm with no murmurs rubs or bruits. Abdomen is soft. Has good bowel sounds. There is no fluid wave. There is no guarding or rebound tenderness. Is no palpable hepato- splenomegaly. Extremities shows no clubbing cyanosis or edema. Skin exam no rashes.  I suppose the diarrhea could be from his chemotherapy. This is not that common but yet it is a possibility.  We will continue to follow along. We will see what his CAT scan shows.  Lum Keas  1 John 1:7-9

## 2013-08-20 LAB — FECAL LACTOFERRIN, QUANT: Fecal Lactoferrin: POSITIVE

## 2013-08-20 LAB — CBC
HEMATOCRIT: 26.9 % — AB (ref 39.0–52.0)
HEMOGLOBIN: 9.2 g/dL — AB (ref 13.0–17.0)
MCH: 31.6 pg (ref 26.0–34.0)
MCHC: 34.2 g/dL (ref 30.0–36.0)
MCV: 92.4 fL (ref 78.0–100.0)
Platelets: 119 10*3/uL — ABNORMAL LOW (ref 150–400)
RBC: 2.91 MIL/uL — AB (ref 4.22–5.81)
RDW: 19.4 % — ABNORMAL HIGH (ref 11.5–15.5)
WBC: 5.1 10*3/uL (ref 4.0–10.5)

## 2013-08-20 MED ORDER — HEPARIN SOD (PORK) LOCK FLUSH 100 UNIT/ML IV SOLN
500.0000 [IU] | INTRAVENOUS | Status: AC | PRN
  Administered 2013-08-20: 500 [IU]
  Filled 2013-08-20: qty 5

## 2013-08-20 NOTE — Discharge Instructions (Signed)
Diarrhea Diarrhea is frequent loose and watery bowel movements. It can cause you to feel weak and dehydrated. Dehydration can cause you to become tired and thirsty, have a dry mouth, and have decreased urination that often is dark yellow. Diarrhea is a sign of another problem, most often an infection that will not last long. In most cases, diarrhea typically lasts 2 3 days. However, it can last longer if it is a sign of something more serious. It is important to treat your diarrhea as directed by your caregive to lessen or prevent future episodes of diarrhea. CAUSES  Some common causes include:  Gastrointestinal infections caused by viruses, bacteria, or parasites.  Food poisoning or food allergies.  Certain medicines, such as antibiotics, chemotherapy, and laxatives.  Artificial sweeteners and fructose.  Digestive disorders. HOME CARE INSTRUCTIONS  Ensure adequate fluid intake (hydration): have 1 cup (8 oz) of fluid for each diarrhea episode. Avoid fluids that contain simple sugars or sports drinks, fruit juices, whole milk products, and sodas. Your urine should be clear or pale yellow if you are drinking enough fluids. Hydrate with an oral rehydration solution that you can purchase at pharmacies, retail stores, and online. You can prepare an oral rehydration solution at home by mixing the following ingredients together:    tsp table salt.   tsp baking soda.   tsp salt substitute containing potassium chloride.  1  tablespoons sugar.  1 L (34 oz) of water.  Certain foods and beverages may increase the speed at which food moves through the gastrointestinal (GI) tract. These foods and beverages should be avoided and include:  Caffeinated and alcoholic beverages.  High-fiber foods, such as raw fruits and vegetables, nuts, seeds, and whole grain breads and cereals.  Foods and beverages sweetened with sugar alcohols, such as xylitol, sorbitol, and mannitol.  Some foods may be well  tolerated and may help thicken stool including:  Starchy foods, such as rice, toast, pasta, low-sugar cereal, oatmeal, grits, baked potatoes, crackers, and bagels.  Bananas.  Applesauce.  Add probiotic-rich foods to help increase healthy bacteria in the GI tract, such as yogurt and fermented milk products.  Wash your hands well after each diarrhea episode.  Only take over-the-counter or prescription medicines as directed by your caregiver.  Take a warm bath to relieve any burning or pain from frequent diarrhea episodes. SEEK IMMEDIATE MEDICAL CARE IF:   You are unable to keep fluids down.  You have persistent vomiting.  You have blood in your stool, or your stools are black and tarry.  You do not urinate in 6 8 hours, or there is only a small amount of very dark urine.  You have abdominal pain that increases or localizes.  You have weakness, dizziness, confusion, or lightheadedness.  You have a severe headache.  Your diarrhea gets worse or does not get better.  You have a fever or persistent symptoms for more than 2 3 days.  You have a fever and your symptoms suddenly get worse. MAKE SURE YOU:   Understand these instructions.  Will watch your condition.  Will get help right away if you are not doing well or get worse. Document Released: 06/07/2002 Document Revised: 06/03/2012 Document Reviewed: 02/23/2012 [employer] Patient Information 2014 [employer].  

## 2013-08-20 NOTE — Progress Notes (Signed)
Discharge instructions reviewed with patient using teach back method and patient demonstrated understanding.  Patient stable for discharge.  Assessment unchanged from this am.  Zandra Abts [facility]  [employer] 08/20/2013  1:14 PM

## 2013-08-20 NOTE — Discharge Summary (Signed)
Physician Discharge Summary  Marcus Lang XHB:[id number] DOB: Dec 06, 1945 DOA: 08/17/2013  PCP: Marcus Reichmann, MD  Admit date: 08/17/2013 Discharge date: 08/20/2013  Recommendations for Outpatient Follow-up:  1. Please note per our discussion you can resume antibiotics for your eye but since the problem was side effect of having diarrhea you wanted to wait until you see an eye doctor which is reasonable. Please call Monday to schedule and appt with your eye doctor to see you as soon as possible to review the medications and perhaps prescribe different treatment  2. Continue same ome meds as prior to discharge.  Discharge Diagnoses:  Principal Problem:   Diarrhea Active Problems:   Hypertension   Pancreas carcinoma   Dehydration   Normocytic anemia   Leukopenia   Toxoplasmosis chorioretinitis of right eye   Generalized weakness   History of DVT (deep vein thrombosis)   Protein-calorie malnutrition, severe    Discharge Condition: medically stable for discharge home today   Diet recommendation: as tolerated   History of present illness:  68 y.o. male with a past medical history of metastatic pancreatic cancer, getting chemotherapy at [facility] through the Incyte clinical trial, last chemo given 08/11/13 (Gemzar and Abraxane, on ruxolitinib), on Bactrim and Clindamycin toxoplasmosis chorioretinitis. Pt presented to [facility] ED 08/17/2013 with complaints of weakness, loss of appetite and worsening diarrhea.   Assessment/Plan:   Principal Problem:  Acute diarrhea  - likely due to septra and clinda; pt reluctant to restart those abx; per ID he can restart on Monday 2/23 but pt told me he will follow up with eye doctor ASAP to see if he can be switched to another medication  - stool culture normal, C.diff negative  - was on flagyl but this is now d/c'ed as all cultures negative   Active Problems:  Hypertension  - at goal  Anemia of chronic disease  - secondary to history of  malignancy and sequela of chemotherapy  - Hgb 6.8 on 2/18 so transfused 2 units PRBC 2/18  Pancreas carcinoma  - per oncology management  Dehydration  - resolved, feels better this am Hypokalemia  - repleted  Leukopenia  - resolved  Toxoplasmosis chorioretinitis of right eye  - appreciate ID following  - as noted above, to restart septra and clinda on Monday  Generalized weakness  - feels better; home with HHPT DVT prophylaxis  - on Lovenox sub Q while in hospital   Code Status: Full.  Family Communication: no family at the bedside   Consultants:  ID  Oncology  Procedures:  none Antibiotics:  Flagyl 08/18/2013 --> 08/20/2013 Was on septra and clinda which was d/c'ed by ID 2/18   Signed:  Leisa Lenz, MD  [facility] 08/20/2013, 12:09 PM  Pager #: [phone]   Discharge Exam: Filed Vitals:   08/20/13 0606  BP: 125/68  Pulse: 78  Temp: 97.9 F (36.6 C)  Resp: 17   Filed Vitals:   08/19/13 0703 08/19/13 1400 08/19/13 2040 08/20/13 0606  BP: 132/87 97/66 115/63 125/68  Pulse: 94 80 73 78  Temp: 98.5 F (36.9 C) 98.2 F (36.8 C) 98.3 F (36.8 C) 97.9 F (36.6 C)  TempSrc: Oral Oral Oral Oral  Resp: 18 16 16 17   Height:      Weight:      SpO2: 99% 99% 97% 100%    General: Pt is alert, follows commands appropriately, not in acute distress Cardiovascular: Regular rate and rhythm, S1/S2 +, no murmurs, no rubs, no  gallops Respiratory: Clear to auscultation bilaterally, no wheezing, no crackles, no rhonchi Abdominal: Soft, non tender, non distended, bowel sounds +, no guarding Extremities: no edema, no cyanosis, pulses palpable bilaterally DP and PT Neuro: Grossly nonfocal  Discharge Instructions  Discharge Orders   Future Orders Complete By Expires   Call MD for:  difficulty breathing, headache or visual disturbances  As directed    Call MD for:  persistant dizziness or light-headedness  As directed    Call MD for:  persistant nausea and  vomiting  As directed    Call MD for:  severe uncontrolled pain  As directed    Diet - low sodium heart healthy  As directed    Discharge instructions  As directed    Comments:     1. Please note per our discussion you can resume antibiotics for your eye but since the problem was side effect of having diarrhea you wanted to wait until you see an eye doctor which is reasonable. Please call Monday to schedule and appt with your eye doctor to see you as soon as possible to review the medications and perhaps prescribe different treatment  2. Continue same ome meds as prior to discharge.   Increase activity slowly  As directed        Medication List         atropine 1 % ophthalmic solution  Place 1 drop into both eyes 2 (two) times daily.     clindamycin 300 MG capsule  Commonly known as:  CLEOCIN  Take 300 mg by mouth 4 (four) times daily.     D 1000 1000 UNITS capsule  Generic drug:  Cholecalciferol  Take 1 capsule by mouth daily. Take 1,000 Units by mouth daily.     DUREZOL 0.05 % Emul  Generic drug:  Difluprednate  Place 1 drop into both eyes 4 (four) times daily.     enoxaparin 60 MG/0.6ML injection  Commonly known as:  LOVENOX  Inject 50 mg into the skin every 12 (twelve) hours.     furosemide 20 MG tablet  Commonly known as:  LASIX  Take 20 mg by mouth daily.     INVESTIGATIONAL DRUG SIMPLE RECORD  - [name] 5 mg (Incyte INCB [id number]) eIRB # [phone]  - 2 tablets bid 12 hours apart. for investigational use only. No grapefruit.     lipase/protease/amylase 12000 UNITS Cpep capsule  Commonly known as:  CREON-12/PANCREASE  Take 1 capsule by mouth 3 (three) times daily before meals.     moxifloxacin 0.5 % ophthalmic solution  Commonly known as:  VIGAMOX  Place into the right eye 4 (four) times daily.     multivitamin with minerals Tabs tablet  Take 1 tablet by mouth daily.     ondansetron 8 MG tablet  Commonly known as:  ZOFRAN  Take 8 mg by mouth every 8  (eight) hours as needed for nausea or vomiting.     oxyCODONE 5 MG immediate release tablet  Commonly known as:  Oxy IR/ROXICODONE  Take 10 mg by mouth every 4 (four) hours as needed for severe pain (pain.).     pantoprazole 40 MG tablet  Commonly known as:  PROTONIX  Take 40 mg by mouth daily.     potassium chloride SA 20 MEQ tablet  Commonly known as:  K-DUR,KLOR-CON  Take 20 mEq by mouth 2 (two) times daily.     sulfamethoxazole-trimethoprim 800-160 MG per tablet  Commonly known as:  BACTRIM DS  Take 1 tablet by mouth 2 (two) times daily.     vitamin B-12 100 MCG tablet  Commonly known as:  CYANOCOBALAMIN  Take 1 tablet by mouth daily. Take 100 mcg by mouth daily.           Follow-up Information   Follow up with DAUB, STEVE A, MD. Schedule an appointment as soon as possible for a visit in 2 weeks.   Specialty:  Family Medicine   Contact information:   [place] [address] [phone]        The results of significant diagnostics from this hospitalization (including imaging, microbiology, ancillary and laboratory) are listed below for reference.    Significant Diagnostic Studies: No results found.  Microbiology: Recent Results (from the past 240 hour(s))  CLOSTRIDIUM DIFFICILE BY PCR     Status: None   Collection Time    08/17/13  8:14 PM      Result Value Ref Range Status   C difficile by pcr NEGATIVE  NEGATIVE Final   Comment: Performed at [facility]: Basic Metabolic Panel:  Recent Labs Lab 08/17/13 1005 08/17/13 1400 08/18/13 0350 08/19/13 0524  NA 134* 135* 136* 139  K 4.0 4.1 3.4* 3.3*  CL 98 100 102 104  CO2 25 23 23 23   GLUCOSE 177* 139* 137* 136*  BUN 10 9 9  5*  CREATININE 0.96 0.81 0.87 0.84  CALCIUM 8.8 8.5 7.9* 8.2*   Liver Function Tests:  Recent Labs Lab 08/17/13 1142 08/17/13 1400  AST 44* 37  ALT 73* 67*  ALKPHOS 131* 127*  BILITOT 0.8 0.5  PROT 5.6* 5.5*  ALBUMIN 3.6 2.8*     Recent Labs Lab 08/17/13 1142  LIPASE 20  AMYLASE 28   No results found for this basename: AMMONIA,  in the last 168 hours CBC:  Recent Labs Lab 08/17/13 1006 08/17/13 1400 08/18/13 0350 08/19/13 0524 08/20/13 0645  WBC 3.4* 2.4* 2.6* 5.2 5.1  NEUTROABS  --  1.7  --   --   --   HGB 9.1* 8.3* 6.8* 9.2* 9.2*  HCT 29.3* 24.0* 20.1* 26.6* 26.9*  MCV 97.6* 92.7 93.5 91.4 92.4  PLT  --  151 122* 119* 119*   Cardiac Enzymes: No results found for this basename: CKTOTAL, CKMB, CKMBINDEX, TROPONINI,  in the last 168 hours BNP: BNP (last 3 results) No results found for this basename: PROBNP,  in the last 8760 hours CBG:  Recent Labs Lab 08/17/13 1304  GLUCAP 129*    Time coordinating discharge: Over 30 minutes

## 2013-08-23 LAB — STOOL CULTURE

## 2013-10-27 ENCOUNTER — Telehealth: Payer: Self-pay | Admitting: *Deleted

## 2013-10-27 ENCOUNTER — Other Ambulatory Visit: Payer: Self-pay | Admitting: *Deleted

## 2013-10-27 DIAGNOSIS — C259 Malignant neoplasm of pancreas, unspecified: Secondary | ICD-10-CM

## 2013-10-27 MED ORDER — PANCRELIPASE (LIP-PROT-AMYL) 12000-38000 UNITS PO CPEP: 2.0000 | ORAL_CAPSULE | Freq: Three times a day (TID) | ORAL | Status: DC

## 2013-10-27 NOTE — Telephone Encounter (Signed)
Pt requested Creon samples. Dr Marin Olp changed prescription to 24,000 three times daily with meals. Samples are waiting with secretary for the patient's daughter to pick up.

## 2013-12-02 ENCOUNTER — Telehealth: Payer: Self-pay | Admitting: Hematology & Oncology

## 2013-12-02 NOTE — Telephone Encounter (Signed)
Pt aware of 6-5 appointment. °

## 2013-12-03 ENCOUNTER — Encounter: Payer: Self-pay | Admitting: Hematology & Oncology

## 2013-12-03 ENCOUNTER — Ambulatory Visit (HOSPITAL_BASED_OUTPATIENT_CLINIC_OR_DEPARTMENT_OTHER): Payer: Medicare Other | Admitting: Hematology & Oncology

## 2013-12-03 ENCOUNTER — Other Ambulatory Visit (HOSPITAL_BASED_OUTPATIENT_CLINIC_OR_DEPARTMENT_OTHER): Payer: Medicare Other | Admitting: Lab

## 2013-12-03 VITALS — BP 115/71 | HR 98 | Temp 98.2°F | Resp 18 | Ht 67.0 in | Wt 103.0 lb

## 2013-12-03 DIAGNOSIS — C259 Malignant neoplasm of pancreas, unspecified: Secondary | ICD-10-CM

## 2013-12-03 DIAGNOSIS — I82290 Acute embolism and thrombosis of other thoracic veins: Secondary | ICD-10-CM

## 2013-12-03 DIAGNOSIS — C787 Secondary malignant neoplasm of liver and intrahepatic bile duct: Secondary | ICD-10-CM

## 2013-12-03 DIAGNOSIS — R64 Cachexia: Secondary | ICD-10-CM

## 2013-12-03 LAB — CBC WITH DIFFERENTIAL (CANCER CENTER ONLY)
BASO#: 0 10*3/uL (ref 0.0–0.2)
BASO%: 0.1 % (ref 0.0–2.0)
EOS ABS: 0 10*3/uL (ref 0.0–0.5)
EOS%: 0.1 % (ref 0.0–7.0)
HCT: 35.6 % — ABNORMAL LOW (ref 38.7–49.9)
HEMOGLOBIN: 12.3 g/dL — AB (ref 13.0–17.1)
LYMPH#: 1 10*3/uL (ref 0.9–3.3)
LYMPH%: 10.9 % — AB (ref 14.0–48.0)
MCH: 34.9 pg — ABNORMAL HIGH (ref 28.0–33.4)
MCHC: 34.6 g/dL (ref 32.0–35.9)
MCV: 101 fL — AB (ref 82–98)
MONO#: 1 10*3/uL — AB (ref 0.1–0.9)
MONO%: 10.3 % (ref 0.0–13.0)
NEUT#: 7.4 10*3/uL — ABNORMAL HIGH (ref 1.5–6.5)
NEUT%: 78.6 % (ref 40.0–80.0)
Platelets: 248 10*3/uL (ref 145–400)
RBC: 3.52 10*6/uL — AB (ref 4.20–5.70)
RDW: 13.5 % (ref 11.1–15.7)
WBC: 9.5 10*3/uL (ref 4.0–10.0)

## 2013-12-03 LAB — CMP (CANCER CENTER ONLY)
ALBUMIN: 3.9 g/dL (ref 3.3–5.5)
ALK PHOS: 78 U/L (ref 26–84)
ALT(SGPT): 22 U/L (ref 10–47)
AST: 20 U/L (ref 11–38)
BUN, Bld: 14 mg/dL (ref 7–22)
CO2: 31 mEq/L (ref 18–33)
Calcium: 9.9 mg/dL (ref 8.0–10.3)
Chloride: 93 mEq/L — ABNORMAL LOW (ref 98–108)
Creat: 1.1 mg/dl (ref 0.6–1.2)
GLUCOSE: 145 mg/dL — AB (ref 73–118)
POTASSIUM: 3.2 meq/L — AB (ref 3.3–4.7)
SODIUM: 137 meq/L (ref 128–145)
TOTAL PROTEIN: 7.2 g/dL (ref 6.4–8.1)
Total Bilirubin: 1 mg/dl (ref 0.20–1.60)

## 2013-12-03 LAB — PREALBUMIN: PREALBUMIN: 21.7 mg/dL (ref 17.0–34.0)

## 2013-12-03 LAB — CANCER ANTIGEN 19-9: CA 19-9: 3842.6 U/mL — ABNORMAL HIGH (ref <35.0)

## 2013-12-03 LAB — LACTATE DEHYDROGENASE: LDH: 170 U/L (ref 94–250)

## 2013-12-03 MED ORDER — MEGESTROL ACETATE 400 MG/10ML PO SUSP: 800.0000 mg | Freq: Every day | ORAL | Status: DC

## 2013-12-06 ENCOUNTER — Other Ambulatory Visit: Payer: Self-pay | Admitting: *Deleted

## 2013-12-06 DIAGNOSIS — C259 Malignant neoplasm of pancreas, unspecified: Secondary | ICD-10-CM

## 2013-12-06 MED ORDER — PANTOPRAZOLE SODIUM 40 MG PO TBEC: 40.0000 mg | DELAYED_RELEASE_TABLET | Freq: Every day | ORAL | Status: DC

## 2013-12-06 NOTE — Progress Notes (Signed)
Hematology and Oncology Follow Up Visit  JAVED COTTO [id number] 1945/09/15 68 y.o. 12/06/2013   Principle Diagnosis:  Metastatic pancreatic cancer Current Therapy:    Patient had been at [facility] for protocol therapy     Interim History:  Mr.  Mcconathy is back for followup. We have not seen him for a long time in the office. We first saw him a year ago when present with metastatic disease was pancreatic cancer. We sent him to [facility] for protocol therapy. He that he had done pretty well.  I saw him last when he was hospitalized in February. He had a thrombus. He had diarrhea. He had lower thought was infectious colitis.  Thrombus within the right brachiocephalic vein. 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  He now has progressed. There is nothing else that he can offer him. He's lost quite a bit of weight. His weight is now 103 pounds.  He is not hurting. His appetite is down. He's had no cough. He's had no bleeding.  Overall, his performance status is ECOG 3.  Medications: Current outpatient prescriptions:dexamethasone (DECADRON) 2 MG tablet, Take 2 mg by mouth 2 (two) times daily. , Disp: , Rfl: ;  dronabinol (MARINOL) 2.5 MG capsule, Take 2.5 mg by mouth 2 (two) times daily before lunch and supper. , Disp: , Rfl: ;  enoxaparin (LOVENOX) 60 MG/0.6ML injection, Inject 50 mg into the skin every 12 (twelve) hours., Disp: , Rfl: ;  HYDROmorphone (DILAUDID) 2 MG tablet, 2 mg every 6 (six) hours as needed. , Disp: , Rfl:  atropine 1 % ophthalmic solution, Place 1 drop into both eyes 2 (two) times daily., Disp: , Rfl: ;  Cholecalciferol (D 1000) 1000 UNITS capsule, Take 1 capsule by mouth daily. Take 1,000 Units by mouth daily., Disp: , Rfl: ;  clindamycin (CLEOCIN) 300 MG capsule, Take 300 mg by mouth 4 (four) times daily., Disp: , Rfl: ;  Difluprednate (DUREZOL) 0.05 % EMUL, Place 1 drop into both eyes 4 (four) times daily., Disp: , Rfl:  megestrol (MEGACE) 400 MG/10ML  suspension, Take 20 mLs (800 mg total) by mouth daily., Disp: 240 mL, Rfl: 2;  moxifloxacin (VIGAMOX) 0.5 % ophthalmic solution, Place into the right eye 4 (four) times daily., Disp: , Rfl: ;  Multiple Vitamin (MULTIVITAMIN WITH MINERALS) TABS, Take 1 tablet by mouth daily., Disp: , Rfl: ;  ondansetron (ZOFRAN) 8 MG tablet, Take 8 mg by mouth every 8 (eight) hours as needed for nausea or vomiting., Disp: , Rfl:  pantoprazole (PROTONIX) 40 MG tablet, Take 40 mg by mouth daily., Disp: , Rfl: ;  potassium chloride SA (K-DUR,KLOR-CON) 20 MEQ tablet, Take 20 mEq by mouth 2 (two) times daily., Disp: , Rfl: ;  vitamin B-12 (CYANOCOBALAMIN) 100 MCG tablet, Take 1 tablet by mouth daily. Take 100 mcg by mouth daily., Disp: , Rfl:   Allergies: No Known Allergies  Past Medical History, Surgical history, Social history, and Family History were reviewed and updated.  Review of Systems: As above   Physical Exam:  height is 5\' 7"  (1.702 m) and weight is 103 lb (46.72 kg). His oral temperature is 98.2 F (36.8 C). His blood pressure is 115/71 and his pulse is 98. His respiration is 18.   Thin Afro-American gentleman in no obvious distress. Head and neck exam shows temporal muscle wasting. He has no oral lesions. There is no scleral icterus . Lungs are clear. Cardiac exam regular rhythm. Abdomen is soft. Has good  bowel sounds. There is no fluid wave. There is no palpable liver or spleen tip. Back exam no tenderness over the spine ribs or hips. Extremities shows muscle atrophy in upper and lower extremities. Skin exam no rashes. Neurological exam is nonfocal.  Lab Results  Component Value Date   WBC 9.5 12/03/2013   HGB 12.3* 12/03/2013   HCT 35.6* 12/03/2013   MCV 101* 12/03/2013   PLT 248 12/03/2013     Chemistry      Component Value Date/Time   NA 137 12/03/2013 1503   NA 139 08/19/2013 0524   K 3.2* 12/03/2013 1503   K 3.3* 08/19/2013 0524   CL 93* 12/03/2013 1503   CL 104 08/19/2013 0524   CO2 31 12/03/2013 1503    CO2 23 08/19/2013 0524   BUN 14 12/03/2013 1503   BUN 5* 08/19/2013 0524   CREATININE 1.1 12/03/2013 1503   CREATININE 0.84 08/19/2013 0524      Component Value Date/Time   CALCIUM 9.9 12/03/2013 1503   CALCIUM 8.2* 08/19/2013 0524   ALKPHOS 78 12/03/2013 1503   ALKPHOS 127* 08/17/2013 1400   AST 20 12/03/2013 1503   AST 37 08/17/2013 1400   ALT 22 12/03/2013 1503   ALT 67* 08/17/2013 1400   BILITOT 1.00 12/03/2013 1503   BILITOT 0.5 08/17/2013 1400      Pre-albumin is 21.7. His CA 19-9 is 3842.   Impression and Plan: Mr. Rhoads is 68 year old gentleman with metastatic pancreatic cancer. He's been at [facility]. He has basically progressed now. He had been on protocol therapy. There is not much else [facility] can do for him On his CT scan at [facility], there are new hepatic metastases. He has increasing pancreatic mass.  His performance status is is not that great. Am not sure that he didn't take any more treatment.  We have to try to improve his performance status. His appetite is quite poor. I will try some Megace on him. Hopefully this can help.  He had bad neuropathy. He has had Abraxane. He has had gemcitabine. He has not had a Xeloda. He's not had FOLFOX or FOLFIRI.  I spent hour with he and his daughter. He was due to see him again. Obviously, his prognosis is quite poor. I don't think he is ready to hear about hospice as of yet.  We will try to get him back in a couple weeks and then reassess him.    Volanda Napoleon, MD 6/8/20156:02 AM

## 2013-12-13 ENCOUNTER — Ambulatory Visit (HOSPITAL_BASED_OUTPATIENT_CLINIC_OR_DEPARTMENT_OTHER): Payer: Medicare Other | Admitting: Hematology & Oncology

## 2013-12-13 ENCOUNTER — Other Ambulatory Visit (HOSPITAL_BASED_OUTPATIENT_CLINIC_OR_DEPARTMENT_OTHER): Payer: Medicare Other | Admitting: Lab

## 2013-12-13 ENCOUNTER — Ambulatory Visit (HOSPITAL_BASED_OUTPATIENT_CLINIC_OR_DEPARTMENT_OTHER): Payer: Medicare Other

## 2013-12-13 ENCOUNTER — Encounter: Payer: Self-pay | Admitting: Hematology & Oncology

## 2013-12-13 VITALS — BP 111/79 | HR 101 | Temp 97.9°F | Resp 16 | Ht 66.0 in | Wt 96.0 lb

## 2013-12-13 DIAGNOSIS — C259 Malignant neoplasm of pancreas, unspecified: Secondary | ICD-10-CM

## 2013-12-13 DIAGNOSIS — R7309 Other abnormal glucose: Secondary | ICD-10-CM

## 2013-12-13 DIAGNOSIS — I82290 Acute embolism and thrombosis of other thoracic veins: Secondary | ICD-10-CM

## 2013-12-13 DIAGNOSIS — R634 Abnormal weight loss: Secondary | ICD-10-CM

## 2013-12-13 DIAGNOSIS — B370 Candidal stomatitis: Secondary | ICD-10-CM

## 2013-12-13 DIAGNOSIS — R739 Hyperglycemia, unspecified: Secondary | ICD-10-CM

## 2013-12-13 DIAGNOSIS — R64 Cachexia: Secondary | ICD-10-CM

## 2013-12-13 LAB — CBC WITH DIFFERENTIAL (CANCER CENTER ONLY)
BASO#: 0 10*3/uL (ref 0.0–0.2)
BASO%: 0.1 % (ref 0.0–2.0)
EOS%: 0.1 % (ref 0.0–7.0)
Eosinophils Absolute: 0 10*3/uL (ref 0.0–0.5)
HCT: 39.1 % (ref 38.7–49.9)
HGB: 13.3 g/dL (ref 13.0–17.1)
LYMPH#: 0.8 10*3/uL — ABNORMAL LOW (ref 0.9–3.3)
LYMPH%: 5 % — ABNORMAL LOW (ref 14.0–48.0)
MCH: 34.3 pg — AB (ref 28.0–33.4)
MCHC: 34 g/dL (ref 32.0–35.9)
MCV: 101 fL — ABNORMAL HIGH (ref 82–98)
MONO#: 1 10*3/uL — ABNORMAL HIGH (ref 0.1–0.9)
MONO%: 6.4 % (ref 0.0–13.0)
NEUT#: 13.5 10*3/uL — ABNORMAL HIGH (ref 1.5–6.5)
NEUT%: 88.4 % — ABNORMAL HIGH (ref 40.0–80.0)
PLATELETS: 262 10*3/uL (ref 145–400)
RBC: 3.88 10*6/uL — ABNORMAL LOW (ref 4.20–5.70)
RDW: 12.9 % (ref 11.1–15.7)
WBC: 15.3 10*3/uL — ABNORMAL HIGH (ref 4.0–10.0)

## 2013-12-13 LAB — CMP (CANCER CENTER ONLY)
ALBUMIN: 3.8 g/dL (ref 3.3–5.5)
ALK PHOS: 136 U/L — AB (ref 26–84)
ALT(SGPT): 53 U/L — ABNORMAL HIGH (ref 10–47)
AST: 29 U/L (ref 11–38)
BUN, Bld: 21 mg/dL (ref 7–22)
CO2: 29 mEq/L (ref 18–33)
Calcium: 9.5 mg/dL (ref 8.0–10.3)
Chloride: 93 mEq/L — ABNORMAL LOW (ref 98–108)
Creat: 0.8 mg/dl (ref 0.6–1.2)
Glucose, Bld: 485 mg/dL — ABNORMAL HIGH (ref 73–118)
Potassium: 4.5 mEq/L (ref 3.3–4.7)
Sodium: 136 mEq/L (ref 128–145)
Total Bilirubin: 1.1 mg/dl (ref 0.20–1.60)
Total Protein: 7.3 g/dL (ref 6.4–8.1)

## 2013-12-13 MED ORDER — INSULIN REGULAR HUMAN 100 UNIT/ML IJ SOLN: 20.0000 [IU] | Freq: Once | INTRAMUSCULAR | Status: DC

## 2013-12-13 MED ORDER — SODIUM CHLORIDE 0.9 % IJ SOLN
10.0000 mL | INTRAMUSCULAR | Status: DC | PRN
  Administered 2013-12-13: 10 mL via INTRAVENOUS
  Filled 2013-12-13: qty 10

## 2013-12-13 MED ORDER — FLUCONAZOLE 100 MG PO TABS: 100.0000 mg | ORAL_TABLET | Freq: Every day | ORAL | Status: DC

## 2013-12-13 MED ORDER — HYDROMORPHONE HCL 2 MG PO TABS: 2.0000 mg | ORAL_TABLET | Freq: Four times a day (QID) | ORAL | Status: DC | PRN

## 2013-12-13 MED ORDER — INSULIN REGULAR HUMAN 100 UNIT/ML IJ SOLN
20.0000 [IU] | Freq: Once | INTRAMUSCULAR | Status: AC
  Administered 2013-12-13: 20 [IU] via SUBCUTANEOUS

## 2013-12-13 MED ORDER — HEPARIN SOD (PORK) LOCK FLUSH 100 UNIT/ML IV SOLN
500.0000 [IU] | Freq: Once | INTRAVENOUS | Status: AC
  Administered 2013-12-13: 500 [IU] via INTRAVENOUS
  Filled 2013-12-13: qty 5

## 2013-12-13 MED ORDER — SODIUM CHLORIDE 0.9 % IV SOLN
Freq: Once | INTRAVENOUS | Status: AC
  Administered 2013-12-13: 15:00:00 via INTRAVENOUS

## 2013-12-13 MED ORDER — GLIPIZIDE 5 MG PO TABS: 2.5000 mg | ORAL_TABLET | Freq: Every day | ORAL | Status: DC

## 2013-12-13 MED ORDER — ENOXAPARIN SODIUM 60 MG/0.6ML ~~LOC~~ SOLN: 50.0000 mg | Freq: Two times a day (BID) | SUBCUTANEOUS | Status: DC

## 2013-12-13 NOTE — Addendum Note (Signed)
Addended by: Burney Gauze R on: 12/13/2013 04:24 PM   Modules accepted: Orders, Medications

## 2013-12-13 NOTE — Addendum Note (Signed)
Addended by: Orlando Penner on: 12/13/2013 05:43 PM   Modules accepted: Orders

## 2013-12-13 NOTE — Patient Instructions (Signed)
Dehydration, Elderly Dehydration means your body does not have as much fluid as it needs. Your kidneys, brain, and heart will not work properly without the right amount of fluids and salt. Older adults are more likely to become dehydrated than younger adults. This is because:   Their bodies do not hold water as well.  Their bodies do not respond to temperature changes as well.  They do not get thirsty as easily or as quickly. HOME CARE  Ask your doctor how to replace body fluid losses (rehydrate).  Drink enough fluids to keep your pee (urine) clear or pale yellow.  Drink small amounts of fluids often if you feel sick to your stomach (nauseous) or throw up (vomit).  Eat like you normally do.  Avoid:  Foods or drinks high in sugar.  Bubbly (carbonated) drinks.  Juice.  Very hot or cold fluids.  Drinks with caffeine.  Fatty, greasy foods.  Alcohol.  Tobacco.  Eating too much.  Gelatin desserts.  Wash your hands to avoid spreading germs (bacteria, viruses).  Only take medicine as told by your doctor.  Keep all doctor visits as told. GET HELP IF:  You have belly (abdominal) pain that gets worse or stays in one spot (localizes).  You have a rash, stiff neck, or bad headache.  You get easily annoyed, sleepy, or are hard to wake up.  You feel weak, dizzy, or very thirsty. GET HELP RIGHT AWAY IF:   You cannot drink fluid without throwing up.  You get worse even with treatment.  You throw up often.  You have watery poop (diarrhea) often.  Your vomit has blood in it or looks greenish.  Your poop (stool) has blood in it or looks black and tarry.  You have not peed in 6 to 8 hours or have only peed a small amount of very dark pee.  You have a fever.  You pass out (faint). MAKE SURE YOU:   Understand these instructions.  Will watch your condition.  Will get help right away if you are not doing well or get worse. Document Released: 06/06/2011 Document  Revised: 04/07/2013 Document Reviewed: 02/22/2013 [facility] Patient Information 2014 [place].

## 2013-12-13 NOTE — Progress Notes (Signed)
Hematology and Oncology Follow Up Visit  Marcus Lang [id number] April 10, 1946 68 y.o. 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 He is not a candidate for systemic therapy     Interim History:  Mr.  Lang is back for followup. Unfortunately, in 2 weeks, he has lost 7 pounds. He now is down to 96 pounds.  I have him on Decadron and Marinol and Megace did not help with his appetite. He is eating but is still losing weight. I think this is highly indicative of his prognosis.  His performance status is ECOG 3. He just is not a candidate for systemic chemotherapy.  I talked to him about hospice. He does not want hospice. He understands what hospice does. He says he just is not need them right now. I told him he will let us know when he needs them.  He has no pain. He is on Dilaudid. He takes this 2 or 3 times a day.  He's had no bleeding. He's had no diarrhea.  I want him on Creon. This may help with his digestion.  I found out that his blood sugar is 485. I should is a combination of factors at doing this. I am cutting his Decadron dose back to 2 mg a day.  He's not coughing. He's had no arm swelling with the right arm. He's doing well with his Lovenox.  Medications: Current outpatient prescriptions:dexamethasone (DECADRON) 2 MG tablet, Take 2 mg by mouth 2 (two) times daily. , Disp: , Rfl: ;  Docusate Sodium (COLACE PO), Take by mouth as needed., Disp: , Rfl: ;  dronabinol (MARINOL) 2.5 MG capsule, Take 2.5 mg by mouth 2 (two) times daily before lunch and supper. , Disp: , Rfl: ;  enoxaparin (LOVENOX) 60 MG/0.6ML injection, Inject 50 mg into the skin every 12 (twelve) hours., Disp: , Rfl:  HYDROmorphone (DILAUDID) 2 MG tablet, 2 mg every 6 (six) hours as needed. , Disp: , Rfl: ;  megestrol (MEGACE) 400 MG/10ML suspension, Take 20 mLs (800 mg total) by mouth daily.,  Disp: 240 mL, Rfl: 2;  ondansetron (ZOFRAN) 8 MG tablet, Take 8 mg by mouth every 8 (eight) hours as needed for nausea or vomiting., Disp: , Rfl: ;  pantoprazole (PROTONIX) 40 MG tablet, Take 1 tablet (40 mg total) by mouth daily., Disp: 30 tablet, Rfl: 3 Cholecalciferol (D 1000) 1000 UNITS capsule, Take 1 capsule by mouth daily. Take 1,000 Units by mouth daily., Disp: , Rfl: ;  clindamycin (CLEOCIN) 300 MG capsule, Take 300 mg by mouth 4 (four) times daily., Disp: , Rfl: ;  Difluprednate (DUREZOL) 0.05 % EMUL, Place 1 drop into both eyes 4 (four) times daily., Disp: , Rfl: ;  moxifloxacin (VIGAMOX) 0.5 % ophthalmic solution, Place into the right eye 4 (four) times daily., Disp: , Rfl:  Multiple Vitamin (MULTIVITAMIN WITH MINERALS) TABS, Take 1 tablet by mouth daily., Disp: , Rfl: ;  potassium chloride SA (K-DUR,KLOR-CON) 20 MEQ tablet, Take 20 mEq by mouth 2 (two) times daily., Disp: , Rfl: ;  vitamin B-12 (CYANOCOBALAMIN) 100 MCG tablet, Take 1 tablet by mouth daily. Take 100 mcg by mouth daily., Disp: , Rfl:   Allergies: No Known Allergies  Past Medical History, Surgical history, Social history, and Family History were reviewed and updated.  Review of Systems: As above  Physical Exam:  height is 5\' 6"  (1.676 m) and weight  is 96 lb (43.545 kg). His oral temperature is 97.9 F (36.6 C). His blood pressure is 111/79 and his pulse is 101. His respiration is 16.   Cachectic gentleman. His lungs are clear. Cardiac exam regular rate rhythm with no murmurs rubs or bruits. Neck exam shows no adenopathy. Head exam shows a temporal muscle wasting. Abdomen is soft. There is epigastric tenderness to palpation. He has no hepatomegaly. Extremities shows some mild nonpitting edema of the right arm. Has good range of motion of his joints. He has 4/5 strength in his legs. Skin exam is slightly dry. Neurological exam is nonfocal.  Lab Results  Component Value Date   WBC 15.3* 12/13/2013   HGB 13.3 12/13/2013    HCT 39.1 12/13/2013   MCV 101* 12/13/2013   PLT 262 12/13/2013     Chemistry      Component Value Date/Time   NA 136 12/13/2013 1335   NA 139 08/19/2013 0524   K 4.5 12/13/2013 1335   K 3.3* 08/19/2013 0524   CL 93* 12/13/2013 1335   CL 104 08/19/2013 0524   CO2 29 12/13/2013 1335   CO2 23 08/19/2013 0524   BUN 21 12/13/2013 1335   BUN 5* 08/19/2013 0524   CREATININE 0.8 12/13/2013 1335   CREATININE 0.84 08/19/2013 0524      Component Value Date/Time   CALCIUM 9.5 12/13/2013 1335   CALCIUM 8.2* 08/19/2013 0524   ALKPHOS 136* 12/13/2013 1335   ALKPHOS 127* 08/17/2013 1400   AST 29 12/13/2013 1335   AST 37 08/17/2013 1400   ALT 53* 12/13/2013 1335   ALT 67* 08/17/2013 1400   BILITOT 1.10 12/13/2013 1335   BILITOT 0.5 08/17/2013 1400         Impression and Plan: Marcus Lang is 68 year old gentleman with metastatic pancreatic cancer. When we last saw him, his CA 19-9 was 3845.  Again, the weight loss is incredibly indicative of his prognosis.  We'll we saw him a couple weeks ago, his prealbumin was 22. This was better than I thought would be. I am sure it will be low in the next I will see him.  I will give him some IV fluids today. I will give him some insulin.  At home, and we will try some glipizide. Marcus Lang this will help control his blood sugars.  I want him back in another couple weeks. We'll see how his weight does.  I had a talk to him about CODE STATUS. He still wants a full CODE STATUS. This I think would not be to his benefit as to keep him alive on a machine clearly is not going to be advantageous to him or improve his quality of life. Marcus Napoleon, MD 6/15/20153:16 PM

## 2013-12-14 LAB — PREALBUMIN: Prealbumin: 21.4 mg/dL (ref 17.0–34.0)

## 2013-12-14 LAB — CANCER ANTIGEN 19-9: CA 19-9: 10613.7 U/mL — ABNORMAL HIGH (ref <35.0)

## 2013-12-16 ENCOUNTER — Other Ambulatory Visit: Payer: Self-pay | Admitting: *Deleted

## 2013-12-16 DIAGNOSIS — C259 Malignant neoplasm of pancreas, unspecified: Secondary | ICD-10-CM

## 2013-12-16 DIAGNOSIS — R64 Cachexia: Secondary | ICD-10-CM

## 2013-12-16 MED ORDER — DRONABINOL 2.5 MG PO CAPS: 2.5000 mg | ORAL_CAPSULE | Freq: Two times a day (BID) | ORAL | Status: DC

## 2013-12-25 ENCOUNTER — Ambulatory Visit: Payer: Medicare Other

## 2013-12-26 ENCOUNTER — Emergency Department (HOSPITAL_COMMUNITY)
Admission: EM | Admit: 2013-12-26 | Discharge: 2013-12-26 | Disposition: A | Discharge status: Home / Self Care | Payer: Medicare Other | Attending: Emergency Medicine | Admitting: Emergency Medicine

## 2013-12-26 ENCOUNTER — Encounter (HOSPITAL_COMMUNITY): Payer: Self-pay | Admitting: Emergency Medicine

## 2013-12-26 DIAGNOSIS — Z8509 Personal history of malignant neoplasm of other digestive organs: Secondary | ICD-10-CM | POA: Insufficient documentation

## 2013-12-26 DIAGNOSIS — R5383 Other fatigue: Secondary | ICD-10-CM

## 2013-12-26 DIAGNOSIS — Z8619 Personal history of other infectious and parasitic diseases: Secondary | ICD-10-CM | POA: Insufficient documentation

## 2013-12-26 DIAGNOSIS — I10 Essential (primary) hypertension: Secondary | ICD-10-CM | POA: Insufficient documentation

## 2013-12-26 DIAGNOSIS — F17200 Nicotine dependence, unspecified, uncomplicated: Secondary | ICD-10-CM | POA: Insufficient documentation

## 2013-12-26 DIAGNOSIS — R630 Anorexia: Secondary | ICD-10-CM | POA: Insufficient documentation

## 2013-12-26 DIAGNOSIS — Z79899 Other long term (current) drug therapy: Secondary | ICD-10-CM | POA: Insufficient documentation

## 2013-12-26 DIAGNOSIS — R109 Unspecified abdominal pain: Secondary | ICD-10-CM | POA: Insufficient documentation

## 2013-12-26 DIAGNOSIS — D649 Anemia, unspecified: Secondary | ICD-10-CM | POA: Insufficient documentation

## 2013-12-26 DIAGNOSIS — Z86718 Personal history of other venous thrombosis and embolism: Secondary | ICD-10-CM | POA: Insufficient documentation

## 2013-12-26 DIAGNOSIS — Z8669 Personal history of other diseases of the nervous system and sense organs: Secondary | ICD-10-CM | POA: Insufficient documentation

## 2013-12-26 DIAGNOSIS — Z7901 Long term (current) use of anticoagulants: Secondary | ICD-10-CM | POA: Insufficient documentation

## 2013-12-26 DIAGNOSIS — IMO0002 Reserved for concepts with insufficient information to code with codable children: Secondary | ICD-10-CM | POA: Insufficient documentation

## 2013-12-26 DIAGNOSIS — R5381 Other malaise: Secondary | ICD-10-CM | POA: Insufficient documentation

## 2013-12-26 LAB — CBC WITH DIFFERENTIAL/PLATELET
Basophils Absolute: 0 10*3/uL (ref 0.0–0.1)
Basophils Relative: 0 % (ref 0–1)
Eosinophils Absolute: 0.1 10*3/uL (ref 0.0–0.7)
Eosinophils Relative: 1 % (ref 0–5)
HCT: 36.5 % — ABNORMAL LOW (ref 39.0–52.0)
Hemoglobin: 12.8 g/dL — ABNORMAL LOW (ref 13.0–17.0)
Lymphocytes Relative: 14 % (ref 12–46)
Lymphs Abs: 1 10*3/uL (ref 0.7–4.0)
MCH: 32.5 pg (ref 26.0–34.0)
MCHC: 35.1 g/dL (ref 30.0–36.0)
MCV: 92.6 fL (ref 78.0–100.0)
Monocytes Absolute: 0.6 10*3/uL (ref 0.1–1.0)
Monocytes Relative: 8 % (ref 3–12)
Neutro Abs: 5.7 10*3/uL (ref 1.7–7.7)
Neutrophils Relative %: 77 % (ref 43–77)
Platelets: 195 10*3/uL (ref 150–400)
RBC: 3.94 MIL/uL — ABNORMAL LOW (ref 4.22–5.81)
RDW: 12.7 % (ref 11.5–15.5)
WBC: 7.5 10*3/uL (ref 4.0–10.5)

## 2013-12-26 LAB — BASIC METABOLIC PANEL
BUN: 15 mg/dL (ref 6–23)
CO2: 25 mEq/L (ref 19–32)
Calcium: 9.3 mg/dL (ref 8.4–10.5)
Chloride: 94 mEq/L — ABNORMAL LOW (ref 96–112)
Creatinine, Ser: 0.76 mg/dL (ref 0.50–1.35)
GFR calc Af Amer: >90 mL/min (ref >90)
GFR calc non Af Amer: >90 mL/min (ref >90)
Glucose, Bld: 192 mg/dL — ABNORMAL HIGH (ref 70–99)
Potassium: 4.5 mEq/L (ref 3.7–5.3)
Sodium: 133 mEq/L — ABNORMAL LOW (ref 137–147)

## 2013-12-26 LAB — CBG MONITORING, ED: Glucose-Capillary: 225 mg/dL — ABNORMAL HIGH (ref 70–99)

## 2013-12-26 MED ORDER — THIAMINE HCL 100 MG/ML IJ SOLN
100.0000 mg | Freq: Once | INTRAMUSCULAR | Status: AC
  Administered 2013-12-26: 100 mg via INTRAVENOUS
  Filled 2013-12-26: qty 2

## 2013-12-26 MED ORDER — SODIUM CHLORIDE 0.9 % IV BOLUS (SEPSIS)
1000.0000 mL | Freq: Once | INTRAVENOUS | Status: AC
  Administered 2013-12-26: 1000 mL via INTRAVENOUS

## 2013-12-26 MED ORDER — HYDROMORPHONE HCL PF 1 MG/ML IJ SOLN
1.0000 mg | Freq: Once | INTRAMUSCULAR | Status: AC
  Administered 2013-12-26: 1 mg via INTRAVENOUS
  Filled 2013-12-26: qty 1

## 2013-12-26 MED ORDER — HYDROMORPHONE HCL 2 MG PO TABS
2.0000 mg | ORAL_TABLET | Freq: Once | ORAL | Status: AC
  Administered 2013-12-26: 2 mg via ORAL
  Filled 2013-12-26: qty 1

## 2013-12-26 NOTE — ED Notes (Signed)
Patient is alert and oriented x3.  He is complaining of weakness with a decrease in appetite.   Patient is a cancer patient with pancreatic cancer.  Patient has been taking his medication and  Has noticed a significant decrease in weight since march.  Patient states that he has to force himself To eat.  He has no problem with liquid.  Currently he rates his pain 8 of 10 in his abdomen.

## 2013-12-26 NOTE — ED Provider Notes (Signed)
CSN: [id number]     Arrival date & time 12/26/13  1542 History   First MD Initiated Contact with Patient 12/26/13 1627     Chief Complaint  Patient presents with  . Fatigue     (Consider location/radiation/quality/duration/timing/severity/associated sxs/prior Treatment) HPI Pt is a 68yo male with metastatic pancreatic cancer followed by Dr. Marin Olp, oncology, presenting to ED c/o worsening generalized fatigue and weakness. Pt states he takes marinol and megace for appetite, however, states the medication has not helped with his appetite or weight gain. Pt is questioning if a feeding tube can be placed. Pt reports chronic pain in umbilicus, 6/76, improves with his home medication of dilaudid taken just PTA.  Denies fever, n/v/d.  Denies change in bowel habits.  Pt was full-code status on 6/15 during f/u appointment with Dr. Marin Olp, today, pt states he is unsure and is in the process of working on his living will.    Past Medical History  Diagnosis Date  . Hypertension   . Pancreas carcinoma 12/30/2012  . Toxoplasmosis chorioretinitis of right eye   . Retinal detachment 06/29/13    OD  . 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 Bilateral cataracts   . Proptosis   . Ptosis   . Exotropia of right eye   . Anemia   . Arthropathy 04/07/12  . Acute pancreatitis 04/07/12  . DVT (deep venous thrombosis)   . Peripheral neuropathy    Past Surgical History  Procedure Laterality Date  . Spine surgery  nov 2008  . Back surgery  jan 2009  . Neck surgery  2008  . Back surgery  2009  . Tonsillectomy  1966  . Eus N/A 12/30/2012    Procedure: ESOPHAGEAL ENDOSCOPIC ULTRASOUND (EUS) RADIAL;  Surgeon: Arta Silence, MD;  Location: [facility];  Service: Endoscopy;  Laterality: N/A;   Family History  Problem Relation Age of Onset  . Cancer Father     Esophageal  . Diabetes Brother   . Alzheimer's disease Mother    History  Substance Use Topics  . Smoking status: Current Every Day Smoker -- 1.00  packs/day for 40 years    Types: Cigarettes    Start date: 12/03/1960    Last Attempt to Quit: 03/20/2008  . Smokeless tobacco: Never Used     Comment: quit 6 years ago--restarted smoking 3 months ago  . Alcohol Use: No     Comment: hx pint week- quit July 2014    Review of Systems  Constitutional: Positive for fatigue. Negative for fever and chills.  Gastrointestinal: Positive for abdominal pain. Negative for nausea, vomiting and diarrhea.  Neurological: Positive for weakness.  All other systems reviewed and are negative.     Allergies  Review of patient's allergies indicates no known allergies.  Home Medications   Prior to Admission medications   Medication Sig Start Date End Date Taking? Authorizing Provider  dexamethasone (DECADRON) 2 MG tablet Take 2 mg by mouth 2 (two) times daily.  11/29/13  Yes Historical Provider, MD  enoxaparin (LOVENOX) 60 MG/0.6ML injection Inject 50 mg into the skin every 12 (twelve) hours.   Yes Historical Provider, MD  glipiZIDE (GLUCOTROL) 5 MG tablet Take 2.5 mg by mouth daily before breakfast.   Yes Historical Provider, MD  HYDROmorphone (DILAUDID) 2 MG tablet Take 2 mg by mouth every 6 (six) hours as needed for severe pain.   Yes Historical Provider, MD  megestrol (MEGACE) 400 MG/10ML suspension Take 800 mg by mouth every morning.  Yes Historical Provider, MD  pantoprazole (PROTONIX) 40 MG tablet Take 40 mg by mouth every morning.   Yes Historical Provider, MD  vitamin B-12 (CYANOCOBALAMIN) 100 MCG tablet Take 100 mcg by mouth every morning.    Yes Historical Provider, MD   BP 134/95  Pulse 72  Temp(Src) 97.6 F (36.4 C) (Oral)  Resp 20  SpO2 99% Physical Exam  Nursing note and vitals reviewed. Constitutional: He appears well-developed and well-nourished.  Cachectic elderly male, non-toxic appearing, lying in exam bed, NAD  HENT:  Head: Normocephalic and atraumatic.  Eyes: Conjunctivae are normal. No scleral icterus.  Neck: Normal  range of motion.  Cardiovascular: Normal rate, regular rhythm and normal heart sounds.   Pulmonary/Chest: Effort normal and breath sounds normal. No respiratory distress. He has no wheezes. He has no rales. He exhibits no tenderness.  Port in right upper chest.  Lungs: CTAB  Abdominal: Soft. Bowel sounds are normal. He exhibits no distension and no mass. There is tenderness. There is no rebound and no guarding.  Soft, non-distended, mild centralized abdominal pain. No rebound, guarding or masses   Musculoskeletal: Normal range of motion.  Neurological: He is alert.  Skin: Skin is warm and dry.    ED Course  Procedures (including critical care time) Labs Review Labs Reviewed  CBC WITH DIFFERENTIAL - Abnormal; Notable for the following:    RBC 3.94 (*)    Hemoglobin 12.8 (*)    HCT 36.5 (*)    All other components within normal limits  BASIC METABOLIC PANEL - Abnormal; Notable for the following:    Sodium 133 (*)    Chloride 94 (*)    Glucose, Bld 192 (*)    All other components within normal limits  CBG MONITORING, ED - Abnormal; Notable for the following:    Glucose-Capillary 225 (*)    All other components within normal limits    Imaging Review No results found.   EKG Interpretation None      MDM   Final diagnoses:  Other fatigue    Pt is a 68yo male with metatstatic pancreatic cancer presenting to ED c/o worsening generalized fatigue and weakness due to lack of appetite despite being on medications for his appetite and digestion. Pt denies fever or new pain.  Pt does appear cachectic but not acutely ill or septic in appearance.  Labs: consistent with previous labs, CBC- Hgb: 12.8, BMP: mildly decreased Na+ 133 down from 136 one week ago. CBG- 225.  Consulted with Dr. Alvy Bimler, oncology on-call for Dr. Marin Olp, agrees no indication for pt to be admitted at this time.  Will give pt 1-2L of IV fluids with thiamine and reassess.   Pt given 2 L of IV fluids and also able  to keep down several ounces of grape juice. Pt states he feels much improved since initially arriving to ED today. Pt states he feels comfortable being discharged home to f/u with Dr. Marin Olp to discuss further tx options for his generalized fatigue.    Discussed pt with Dr. Winfred Leeds throughout pt's ED visit, he also examined pt and agrees with tx plan and disposition.      Noland Fordyce, PA-C 12/26/13 2143

## 2013-12-26 NOTE — ED Provider Notes (Signed)
Complains of generalized weakness and fatigue and diminished appetite for the past several months, and by weight loss. Patient feels dehydrated. He had 2 normal bowel movements today. No fever. No other associated symptoms. He's been treated with Megace, and steroids to enhance his appetite. On exam no distress alert chronically ill appearing HEENT exam experience dry pink anicteric neck supple lungs clear auscultation heart regular rate and rhythm abdomen nondistended normal active bowel sounds nontender extremities no edema skin warm dry  Orlie Dakin, MD 12/26/13 1701

## 2013-12-27 ENCOUNTER — Ambulatory Visit (HOSPITAL_BASED_OUTPATIENT_CLINIC_OR_DEPARTMENT_OTHER): Payer: Medicare Other

## 2013-12-27 ENCOUNTER — Ambulatory Visit (HOSPITAL_BASED_OUTPATIENT_CLINIC_OR_DEPARTMENT_OTHER): Payer: Medicare Other | Admitting: Hematology & Oncology

## 2013-12-27 ENCOUNTER — Ambulatory Visit: Payer: Medicare Other

## 2013-12-27 ENCOUNTER — Encounter: Payer: Self-pay | Admitting: Hematology & Oncology

## 2013-12-27 VITALS — BP 119/78 | HR 86 | Temp 98.1°F | Resp 18 | Ht 66.0 in | Wt 95.0 lb

## 2013-12-27 DIAGNOSIS — R64 Cachexia: Secondary | ICD-10-CM

## 2013-12-27 DIAGNOSIS — C259 Malignant neoplasm of pancreas, unspecified: Secondary | ICD-10-CM

## 2013-12-27 MED ORDER — SODIUM CHLORIDE 0.9 % IV SOLN
Freq: Once | INTRAVENOUS | Status: AC
  Administered 2013-12-27: 12:00:00 via INTRAVENOUS

## 2013-12-27 MED ORDER — SODIUM CHLORIDE 0.9 % IJ SOLN
10.0000 mL | INTRAMUSCULAR | Status: DC | PRN
  Administered 2013-12-27: 10 mL via INTRAVENOUS
  Filled 2013-12-27: qty 10

## 2013-12-27 MED ORDER — HEPARIN SOD (PORK) LOCK FLUSH 100 UNIT/ML IV SOLN
500.0000 [IU] | Freq: Once | INTRAVENOUS | Status: AC
  Administered 2013-12-27: 500 [IU] via INTRAVENOUS
  Filled 2013-12-27: qty 5

## 2013-12-27 NOTE — ED Provider Notes (Signed)
Medical screening examination/treatment/procedure(s) were conducted as a shared visit with non-physician practitioner(s) and myself.  I personally evaluated the patient during the encounter.   EKG Interpretation None       Orlie Dakin, MD 12/27/13 [phone]

## 2013-12-27 NOTE — Patient Instructions (Signed)

## 2013-12-28 ENCOUNTER — Other Ambulatory Visit: Payer: Medicare Other | Admitting: Lab

## 2013-12-28 ENCOUNTER — Ambulatory Visit: Payer: Medicare Other

## 2013-12-28 ENCOUNTER — Ambulatory Visit: Payer: Medicare Other | Admitting: Hematology & Oncology

## 2013-12-28 NOTE — Progress Notes (Signed)
Hematology and Oncology Follow Up Visit  Marcus Lang [id number] 1946-06-02 68 y.o. 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 I don't foresee, has declined continues. He was in the emergency room over the weekend. Is getting IV fluids.  Despite our efforts to have him try to gain weight,, he continues to lose weight. He is now down to 95 pounds. He was on Decadron and Marinol and Megace. These have not helped him. He's going to stop these.  He is not having any pain which is a blessing. He him this is declined because of his weight loss of life of appetite.  He and I had a very long talk today. He just does not want to try anything else. We talked about the use of tube feeds. I told him that if his situation tube feeds would not help and that probably it would hurt. There is increased risk of infections with tube feeds.  We talked about hospice. I think hospice would be a great idea for him and his family. I the hospital to help him. We did do IV fluids at home. This will make life easier for him. He has agreed to this.  We also talked about end-of-life issues. I think that if he makes it through July I would be very surprised. He would not benefit from life support measures of CPR or intubation. I told him this. He understands. He is a very strong faith. He does not want to be kept alive on machines. As such, he is a  NO CODE BLUE.           Medications: Current outpatient prescriptions:enoxaparin (LOVENOX) 60 MG/0.6ML injection, Inject 50 mg into the skin every 12 (twelve) hours., Disp: , Rfl: ;  glipiZIDE (GLUCOTROL) 5 MG tablet, Take 2.5 mg by mouth daily before breakfast., Disp: , Rfl: ;  HYDROmorphone (DILAUDID) 2 MG tablet, Take 2 mg by mouth every 6 (six) hours as needed for severe pain., Disp: ,  Rfl:  dexamethasone (DECADRON) 2 MG tablet, Take 2 mg by mouth 2 (two) times daily. , Disp: , Rfl: ;  dronabinol (MARINOL) 2.5 MG capsule, , Disp: , Rfl: ;  fluconazole (DIFLUCAN) 100 MG tablet, , Disp: , Rfl: ;  megestrol (MEGACE) 400 MG/10ML suspension, Take 800 mg by mouth every morning., Disp: , Rfl: ;  pantoprazole (PROTONIX) 40 MG tablet, Take 40 mg by mouth every morning., Disp: , Rfl:  vitamin B-12 (CYANOCOBALAMIN) 100 MCG tablet, Take 100 mcg by mouth every morning. , Disp: , Rfl:   Allergies: No Known Allergies  Past Medical History, Surgical history, Social history, and Family History were reviewed and updated.  Review of Systems: As above  Physical Exam:  height is 5\' 6"  (1.676 m) and weight is 95 lb (43.092 kg). His oral temperature is 98.1 F (36.7 C). His blood pressure is 119/78 and his pulse is 86. His respiration is 18.   Cachectic African American gentleman. Lungs are clear. Cardiac exam regular in rhythm. Abdomen soft. Has good bowel sounds. He has no fluid wave. There is no palpable liver or spleen. Extremities shows no clubbing cyanosis or edema. Neurological exam is nonfocal. Skin exam is totally dry. He has no mucositis. Neurological exam is nonfocal.  Lab Results  Component Value Date   WBC 7.5  12/26/2013   HGB 12.8* 12/26/2013   HCT 36.5* 12/26/2013   MCV 92.6 12/26/2013   PLT 195 12/26/2013     Chemistry      Component Value Date/Time   NA 133* 12/26/2013 1711   NA 136 12/13/2013 1335   K 4.5 12/26/2013 1711   K 4.5 12/13/2013 1335   CL 94* 12/26/2013 1711   CL 93* 12/13/2013 1335   CO2 25 12/26/2013 1711   CO2 29 12/13/2013 1335   BUN 15 12/26/2013 1711   BUN 21 12/13/2013 1335   CREATININE 0.76 12/26/2013 1711   CREATININE 0.8 12/13/2013 1335      Component Value Date/Time   CALCIUM 9.3 12/26/2013 1711   CALCIUM 9.5 12/13/2013 1335   ALKPHOS 136* 12/13/2013 1335   ALKPHOS 127* 08/17/2013 1400   AST 29 12/13/2013 1335   AST 37 08/17/2013 1400   ALT 53* 12/13/2013  1335   ALT 67* 08/17/2013 1400   BILITOT 1.10 12/13/2013 1335   BILITOT 0.5 08/17/2013 1400         Impression and Plan: Marcus Lang is a 68 year old gentleman. Has metastatic pancreatic cancer. His last CA 19-9 from last week was at 10,000. It had been about 3000 2 weeks before. He is declining. The decline is obvious. His performance status is ECOG 3-4.  He comes in in a wheelchair. He looks very cachectic. Again riptide although we can try to get him to eat better.  Our focus now is pure comfort care. Again, I don't think he would make it through July.  We will get hospice involved. We will make the referral.  We will get him out of the office as needed. I know it is hard to get him out of the office.  We spent about 45 minutes or so with him and his family today. His family is very nice. His family understand the situation and just want him to be comfortable and have some quality of life.  Volanda Napoleon, MD 6/30/20157:10 AM

## 2014-01-17 ENCOUNTER — Ambulatory Visit (HOSPITAL_BASED_OUTPATIENT_CLINIC_OR_DEPARTMENT_OTHER)

## 2014-01-17 ENCOUNTER — Other Ambulatory Visit: Payer: Self-pay | Admitting: *Deleted

## 2014-01-17 DIAGNOSIS — C259 Malignant neoplasm of pancreas, unspecified: Secondary | ICD-10-CM

## 2014-01-17 DIAGNOSIS — C252 Malignant neoplasm of tail of pancreas: Secondary | ICD-10-CM

## 2014-01-17 DIAGNOSIS — R64 Cachexia: Secondary | ICD-10-CM

## 2014-01-17 MED ORDER — SODIUM CHLORIDE 0.9 % IV SOLN
Freq: Once | INTRAVENOUS | Status: AC
  Administered 2014-01-17: 14:00:00 via INTRAVENOUS

## 2014-01-17 MED ORDER — BACLOFEN 10 MG PO TABS: 10.0000 mg | ORAL_TABLET | Freq: Three times a day (TID) | ORAL | Status: AC | PRN

## 2014-01-17 NOTE — Patient Instructions (Signed)
Dehydration, Adult Dehydration is when you lose more fluids from the body than you take in. Vital organs like the kidneys, brain, and heart cannot function without a proper amount of fluids and salt. Any loss of fluids from the body can cause dehydration.  CAUSES   Vomiting.  Diarrhea.  Excessive sweating.  Excessive urine output.  Fever. SYMPTOMS  Mild dehydration  Thirst.  Dry lips.  Slightly dry mouth. Moderate dehydration  Very dry mouth.  Sunken eyes.  Skin does not bounce back quickly when lightly pinched and released.  Dark urine and decreased urine production.  Decreased tear production.  Headache. Severe dehydration  Very dry mouth.  Extreme thirst.  Rapid, weak pulse (more than 100 beats per minute at rest).  Cold hands and feet.  Not able to sweat in spite of heat and temperature.  Rapid breathing.  Blue lips.  Confusion and lethargy.  Difficulty being awakened.  Minimal urine production.  No tears. DIAGNOSIS  Your caregiver will diagnose dehydration based on your symptoms and your exam. Blood and urine tests will help confirm the diagnosis. The diagnostic evaluation should also identify the cause of dehydration. TREATMENT  Treatment of mild or moderate dehydration can often be done at home by increasing the amount of fluids that you drink. It is best to drink small amounts of fluid more often. Drinking too much at one time can make vomiting worse. Refer to the home care instructions below. Severe dehydration needs to be treated at the hospital where you will probably be given intravenous (IV) fluids that contain water and electrolytes. HOME CARE INSTRUCTIONS   Ask your caregiver about specific rehydration instructions.  Drink enough fluids to keep your urine clear or pale yellow.  Drink small amounts frequently if you have nausea and vomiting.  Eat as you normally do.  Avoid:  Foods or drinks high in sugar.  Carbonated  drinks.  Juice.  Extremely hot or cold fluids.  Drinks with caffeine.  Fatty, greasy foods.  Alcohol.  Tobacco.  Overeating.  Gelatin desserts.  Wash your hands well to avoid spreading bacteria and viruses.  Only take over-the-counter or prescription medicines for pain, discomfort, or fever as directed by your caregiver.  Ask your caregiver if you should continue all prescribed and over-the-counter medicines.  Keep all follow-up appointments with your caregiver. SEEK MEDICAL CARE IF:  You have abdominal pain and it increases or stays in one area (localizes).  You have a rash, stiff neck, or severe headache.  You are irritable, sleepy, or difficult to awaken.  You are weak, dizzy, or extremely thirsty. SEEK IMMEDIATE MEDICAL CARE IF:   You are unable to keep fluids down or you get worse despite treatment.  You have frequent episodes of vomiting or diarrhea.  You have blood or green matter (bile) in your vomit.  You have blood in your stool or your stool looks black and tarry.  You have not urinated in 6 to 8 hours, or you have only urinated a small amount of very dark urine.  You have a fever.  You faint. MAKE SURE YOU:   Understand these instructions.  Will watch your condition.  Will get help right away if you are not doing well or get worse. Document Released: 06/17/2005 Document Revised: 09/09/2011 Document Reviewed: 02/04/2011 Exit[employer] Patient Information 2015 [employer]. This information is not intended to replace advice given to you by your health care provider. Make sure you discuss any questions you have with your health care   provider.  

## 2014-01-25 ENCOUNTER — Encounter: Payer: Self-pay | Admitting: Nurse Practitioner

## 2014-01-25 NOTE — Progress Notes (Signed)
Received a call from [place] that pt has decided to d/c taking Lovenox. Dr. Marin Olp made aware and is in support of this decision.

## 2014-01-27 ENCOUNTER — Encounter: Payer: Self-pay | Admitting: *Deleted

## 2014-01-29 NOTE — Progress Notes (Signed)
Received phone call from [place] at [facility] stating the pt passed away on January 28, 2014 at 2:45pm.

## 2014-01-29 DEATH — deceased

## 2014-09-10 IMAGING — CT CT ABD-PELV W/ CM
2 of 5 series · 16 of 46 positions shown, 18 images · IV contrast (READICAT/WATER & [ID] OMNI 300)
Comparison: CT scan 01/01/2012.

CLINICAL DATA: Weight loss and epigastric discomfort.

CT ABDOMEN AND PELVIS WITH CONTRAST
TECHNIQUE: Multidetector CT imaging of the abdomen and pelvis was
performed following the standard protocol during bolus
administration of intravenous contrast.
Contrast: 100mL OMNIPAQUE IOHEXOL 300 MG/ML  SOLN

[Series 2: abd/pelvis with · axial · 0.64mm/px · z∈[-376,-16]mm · 13 of 82 slices shown, 15 images]
[im 5/82  soft-tissue]
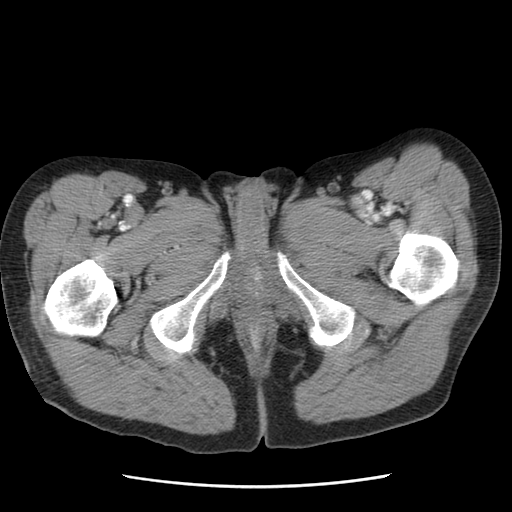
[im 5/82  bone]
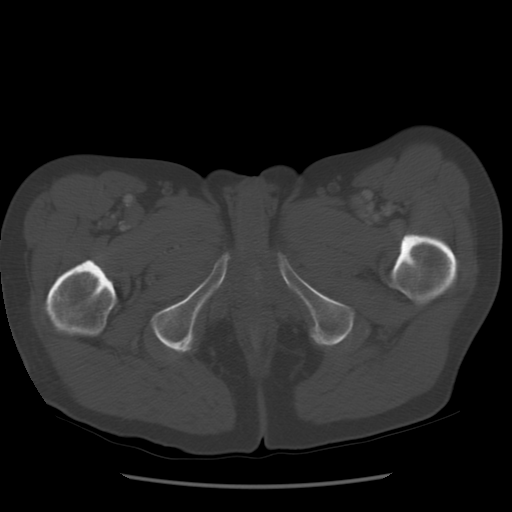
[im 10/82  soft-tissue]
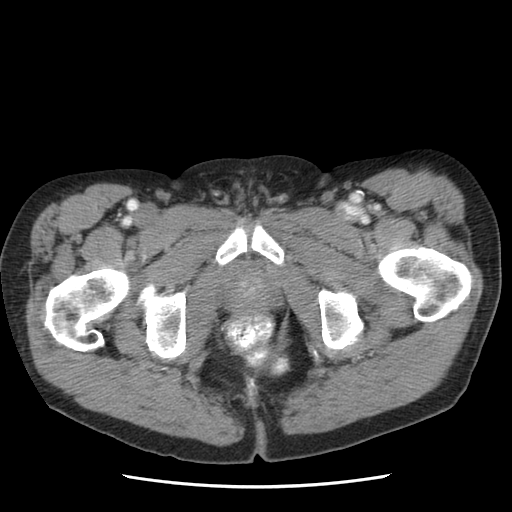
[im 20/82  soft-tissue]
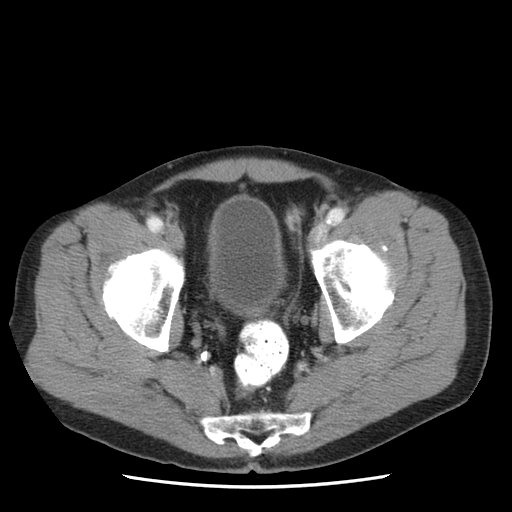
[im 24/82  soft-tissue]
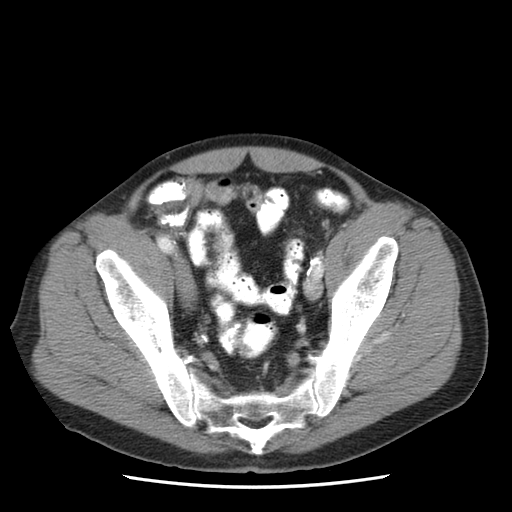
[im 29/82  soft-tissue]
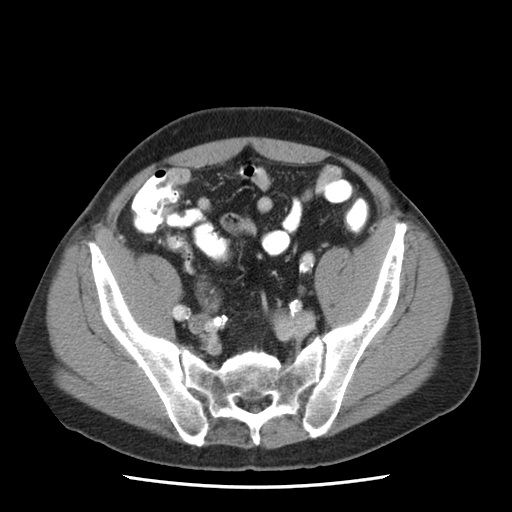
[im 34/82  soft-tissue]
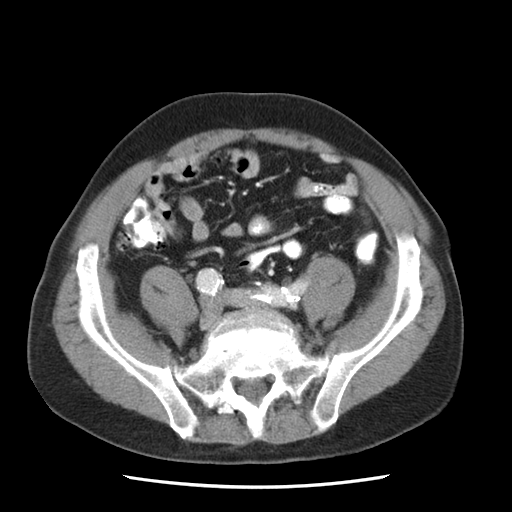
[im 43/82  soft-tissue]
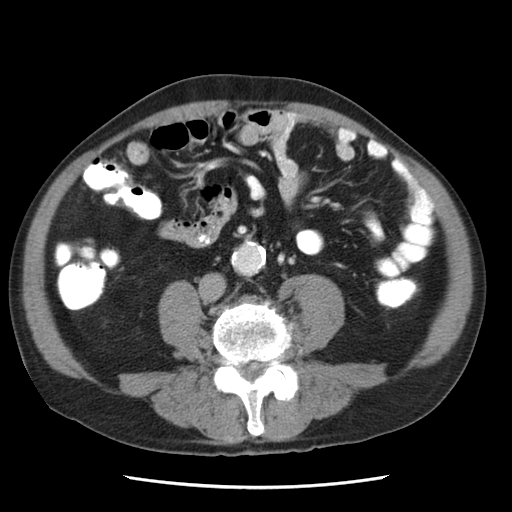
[im 48/82  soft-tissue]
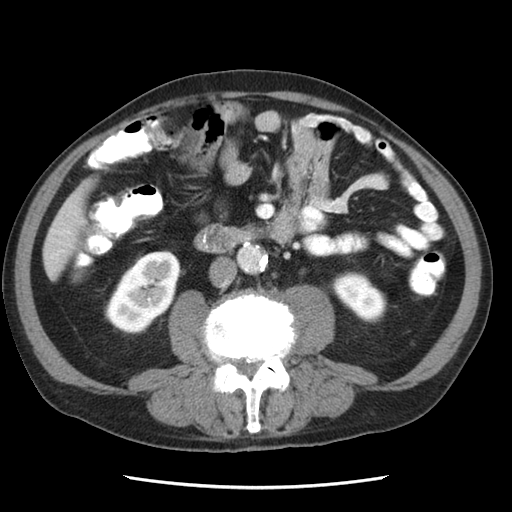
[im 53/82  soft-tissue]
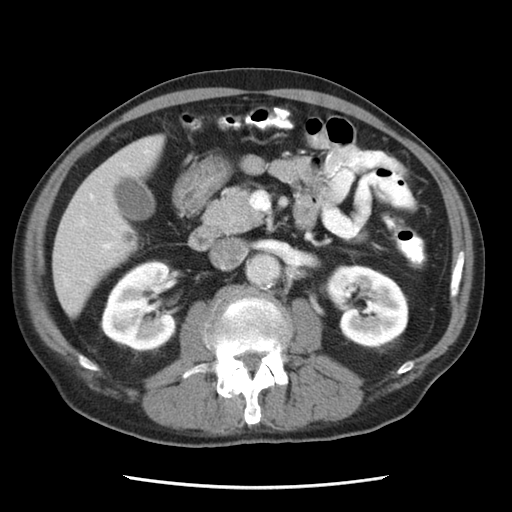
[im 53/82  bone]
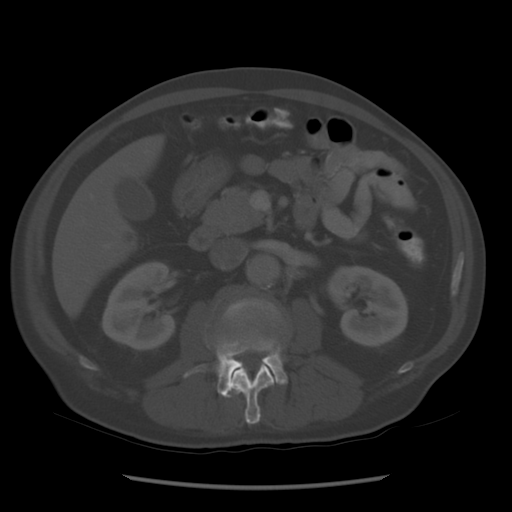
[im 58/82  soft-tissue]
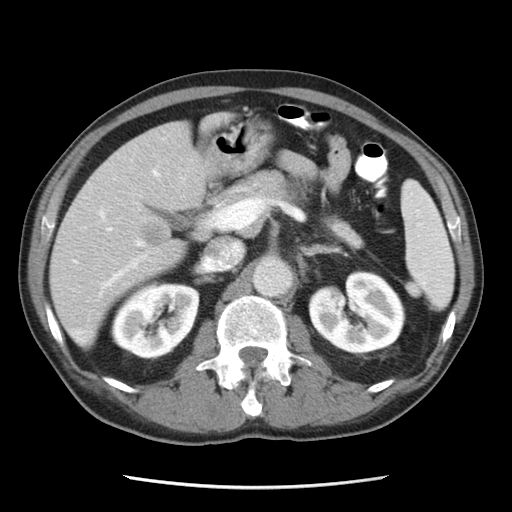
[im 62/82  soft-tissue]
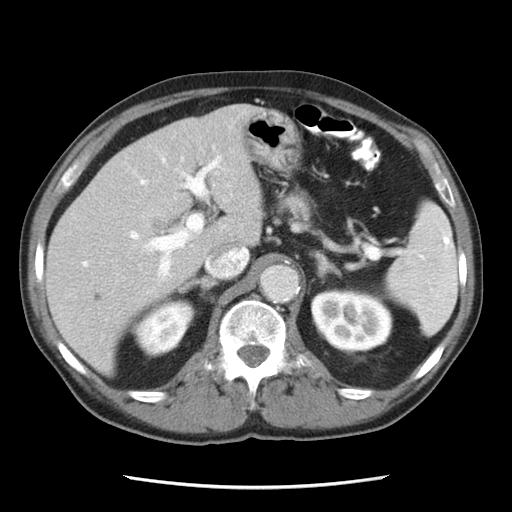
[im 72/82  soft-tissue]
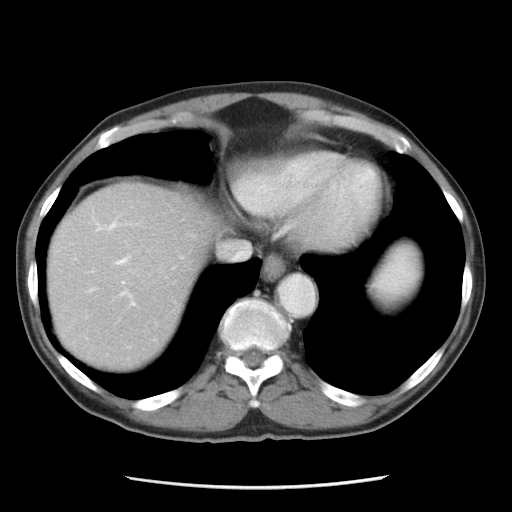
[im 77/82  soft-tissue]
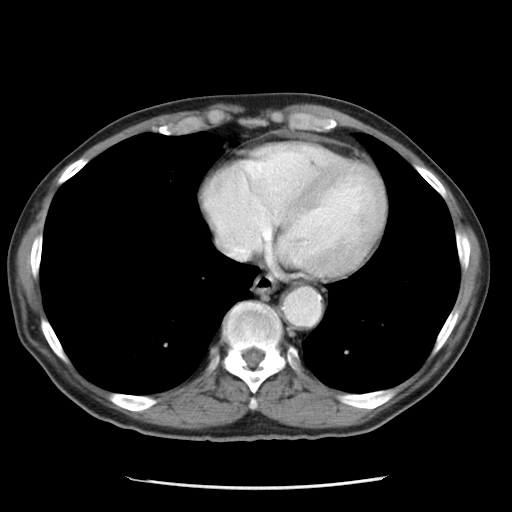

[Series 400: cor · coronal · 0.83mm/px · 3 of 129 slices shown]
[im 43/129  soft-tissue]
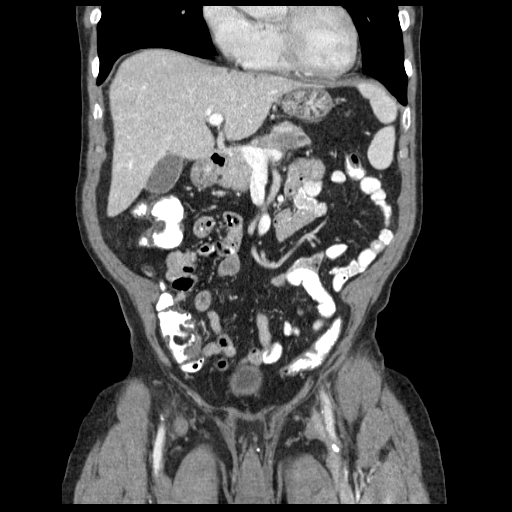
[im 57/129  soft-tissue]
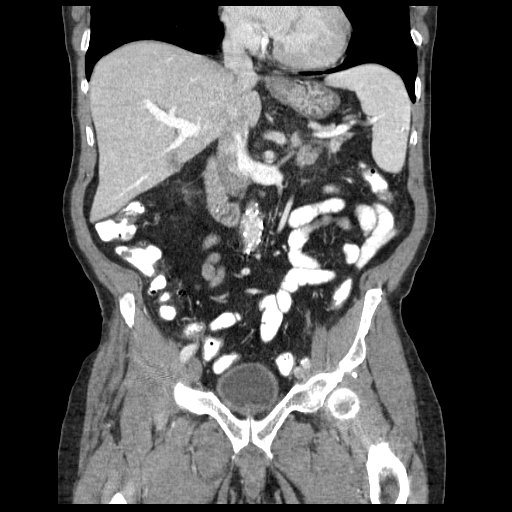
[im 72/129  soft-tissue]
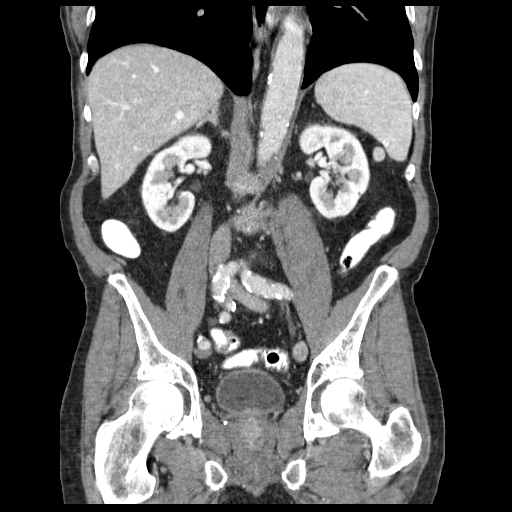

[16 of 46 positions shown; findings below may reference images not displayed]

FINDINGS: The lung bases are clear.  No pleural effusion and/or
pulmonary nodules.  The heart is normal in size.  No pericardial
effusion.  The distal esophagus is unremarkable.  A small hiatal
hernia is suspected.

There are multiple stable benign appearing hepatic cysts.  There is
however a new 23 x 17 mm lesion in the right hepatic lobe just
posterior to the gallbladder and 12 x 9.5 mm lesion near the middle
portal vein.   I do not see a definite colon cancer or mesenteric
or retroperitoneal adenopathy.  The portal and hepatic veins are
patent.

There is an ill-defined lesion in the pancreatic body with proximal
dilatation of the main pancreatic duct.  This is very concerning
for a pancreatic adenocarcinoma.  Measures approximately 23 x 19
mm.  Even in retrospect I do not see this on the prior examination.

The spleen is normal in size.  Stable calcified granulomas and
small splenules.  The adrenal glands and kidneys are normal and
stable.

The stomach, duodenum, small bowel and colon are unremarkable.  No
mesenteric or retroperitoneal mass or adenopathy.  I do not see any
peripancreatic adenopathy.  The splenic vein is patent.  The aorta
demonstrates moderate atherosclerotic calcifications but no focal
aneurysm or dissection.  The major branch vessels are patent.

The bladder, prostate gland and seminal vesicles are unremarkable.
No pelvic mass, adenopathy or free pelvic fluid collections.  No
inguinal mass or hernia.

The bony structures are unremarkable and stable.  There are a few
small scattered stable sclerotic bone lesions which are likely
benign bone islands.  Surgical changes are noted in the lumbar
spine.
IMPRESSION: 1.  New 23 x 19 mm pancreatic body lesion with proximal
obstruction/dilatation of the pancreatic duct.  This is concerning
for pancreatic adenocarcinoma.

2.  New 23 x 17 mm and 12 x 9.5 mm right hepatic lobe lesions
worrisome for metastasis.
3.  No mesenteric or retroperitoneal lymphadenopathy.
4.  Stable moderate atherosclerotic calcification involving the
aorta and iliac arteries.
5.  No pulmonary nodules or worrisome bone lesions.

## 2015-04-04 ENCOUNTER — Encounter: Payer: Self-pay | Admitting: Emergency Medicine
